# Patient Record
Sex: Female | Born: 1945 | Race: Black or African American | Hispanic: No | Smoking: Never smoker
Health system: Southern US, Community
[De-identification: ages and names within clinical notes are randomized; demographics above are authoritative.]

## PROBLEM LIST (undated history)

## (undated) DIAGNOSIS — M1990 Unspecified osteoarthritis, unspecified site: Secondary | ICD-10-CM

## (undated) DIAGNOSIS — I10 Essential (primary) hypertension: Secondary | ICD-10-CM

## (undated) DIAGNOSIS — N390 Urinary tract infection, site not specified: Secondary | ICD-10-CM

## (undated) DIAGNOSIS — E0590 Thyrotoxicosis, unspecified without thyrotoxic crisis or storm: Secondary | ICD-10-CM

## (undated) HISTORY — PX: COLONOSCOPY: SHX174

---

## 1999-04-22 ENCOUNTER — Ambulatory Visit (HOSPITAL_BASED_OUTPATIENT_CLINIC_OR_DEPARTMENT_OTHER): Admission: RE | Admit: 1999-04-22 | Discharge: 1999-04-22 | Payer: Self-pay | Admitting: Oral Surgery

## 1999-09-18 ENCOUNTER — Other Ambulatory Visit: Admission: RE | Admit: 1999-09-18 | Discharge: 1999-09-18 | Payer: Self-pay | Admitting: Obstetrics

## 1999-10-15 ENCOUNTER — Ambulatory Visit (HOSPITAL_COMMUNITY): Admission: RE | Admit: 1999-10-15 | Discharge: 1999-10-15 | Payer: Self-pay | Admitting: Obstetrics

## 1999-10-15 ENCOUNTER — Encounter: Payer: Self-pay | Admitting: Obstetrics

## 2000-11-03 ENCOUNTER — Encounter: Payer: Self-pay | Admitting: Obstetrics

## 2000-11-03 ENCOUNTER — Ambulatory Visit (HOSPITAL_COMMUNITY): Admission: RE | Admit: 2000-11-03 | Discharge: 2000-11-03 | Payer: Self-pay | Admitting: Obstetrics

## 2001-11-09 ENCOUNTER — Encounter: Payer: Self-pay | Admitting: Obstetrics

## 2001-11-09 ENCOUNTER — Ambulatory Visit (HOSPITAL_COMMUNITY): Admission: RE | Admit: 2001-11-09 | Discharge: 2001-11-09 | Payer: Self-pay | Admitting: Obstetrics

## 2002-11-11 ENCOUNTER — Encounter: Payer: Self-pay | Admitting: Obstetrics

## 2002-11-11 ENCOUNTER — Ambulatory Visit (HOSPITAL_COMMUNITY): Admission: RE | Admit: 2002-11-11 | Discharge: 2002-11-11 | Payer: Self-pay | Admitting: Obstetrics

## 2003-11-14 ENCOUNTER — Ambulatory Visit (HOSPITAL_COMMUNITY): Admission: RE | Admit: 2003-11-14 | Discharge: 2003-11-14 | Payer: Self-pay | Admitting: Obstetrics

## 2004-11-14 ENCOUNTER — Ambulatory Visit (HOSPITAL_COMMUNITY): Admission: RE | Admit: 2004-11-14 | Discharge: 2004-11-14 | Payer: Self-pay | Admitting: Obstetrics

## 2005-11-18 ENCOUNTER — Ambulatory Visit (HOSPITAL_COMMUNITY): Admission: RE | Admit: 2005-11-18 | Discharge: 2005-11-18 | Payer: Self-pay | Admitting: Obstetrics

## 2006-11-23 ENCOUNTER — Ambulatory Visit (HOSPITAL_COMMUNITY): Admission: RE | Admit: 2006-11-23 | Discharge: 2006-11-23 | Payer: Self-pay | Admitting: Obstetrics

## 2007-11-24 ENCOUNTER — Ambulatory Visit (HOSPITAL_COMMUNITY): Admission: RE | Admit: 2007-11-24 | Discharge: 2007-11-24 | Payer: Self-pay | Admitting: Obstetrics

## 2008-11-28 ENCOUNTER — Ambulatory Visit (HOSPITAL_COMMUNITY): Admission: RE | Admit: 2008-11-28 | Discharge: 2008-11-28 | Payer: Self-pay | Admitting: Obstetrics

## 2009-11-29 ENCOUNTER — Ambulatory Visit (HOSPITAL_COMMUNITY): Admission: RE | Admit: 2009-11-29 | Discharge: 2009-11-29 | Payer: Self-pay | Admitting: Obstetrics

## 2012-09-08 ENCOUNTER — Other Ambulatory Visit (HOSPITAL_COMMUNITY): Payer: Self-pay | Admitting: Family Medicine

## 2012-09-08 DIAGNOSIS — Z1231 Encounter for screening mammogram for malignant neoplasm of breast: Secondary | ICD-10-CM

## 2012-09-16 ENCOUNTER — Ambulatory Visit (HOSPITAL_COMMUNITY)
Admission: RE | Admit: 2012-09-16 | Discharge: 2012-09-16 | Disposition: A | Discharge status: Home / Self Care | Payer: BC Managed Care – PPO | Source: Ambulatory Visit | Attending: Family Medicine | Admitting: Family Medicine

## 2012-09-16 DIAGNOSIS — Z1231 Encounter for screening mammogram for malignant neoplasm of breast: Secondary | ICD-10-CM | POA: Insufficient documentation

## 2013-08-15 ENCOUNTER — Other Ambulatory Visit (HOSPITAL_COMMUNITY): Payer: Self-pay | Admitting: Family Medicine

## 2013-08-15 DIAGNOSIS — Z1231 Encounter for screening mammogram for malignant neoplasm of breast: Secondary | ICD-10-CM

## 2013-09-19 ENCOUNTER — Ambulatory Visit (HOSPITAL_COMMUNITY)
Admission: RE | Admit: 2013-09-19 | Discharge: 2013-09-19 | Disposition: A | Discharge status: Home / Self Care | Payer: Medicare PPO | Source: Ambulatory Visit | Attending: Family Medicine | Admitting: Family Medicine

## 2013-09-19 DIAGNOSIS — Z1231 Encounter for screening mammogram for malignant neoplasm of breast: Secondary | ICD-10-CM | POA: Insufficient documentation

## 2013-12-19 DIAGNOSIS — N390 Urinary tract infection, site not specified: Secondary | ICD-10-CM

## 2013-12-19 HISTORY — DX: Urinary tract infection, site not specified: N39.0

## 2013-12-22 NOTE — Progress Notes (Signed)
Will need ordedrs in EPIC by 12/27/13 please - pt is coming for preop TUES 12/27/13 - thank you

## 2013-12-23 ENCOUNTER — Encounter (HOSPITAL_COMMUNITY): Payer: Self-pay | Admitting: Pharmacy Technician

## 2013-12-23 ENCOUNTER — Other Ambulatory Visit: Payer: Self-pay | Admitting: Orthopedic Surgery

## 2013-12-26 ENCOUNTER — Other Ambulatory Visit (HOSPITAL_COMMUNITY): Payer: Self-pay | Admitting: Orthopedic Surgery

## 2013-12-26 NOTE — Patient Instructions (Signed)
Your procedure is scheduled on:  01/04/14  [facility]  Report to [facility]-- MAIN ENTRANCE- FOLLOW SIGNS TO SHORT STAY CENTER Short Stay Center at      1:45  PM  Call this number if you have problems the morning of surgery: [phone]        Do not eat food After Midnight. Tuesday NIGHT--- MAY HAVE CLEAR LIQUIDS Wednesday MORNING UNTIL 09:45 AM--- THEN NOTHING BY MOUTH   Take these medicines the morning of surgery with A SIP OF WATER:NO REGULAR MEDICATIONS MAY TAKE  NORCO FOR PAIN IF NEEDED   .  Contacts, dentures or partial plates, or metal hairpins  can not be worn to surgery. Your family will be responsible for glasses, dentures, hearing aides while you are in surgery  Leave suitcase in the car. After surgery it may be brought to your room.  For patients admitted to the hospital, checkout time is 11:00 AM day of  discharge.         [facility] IS NOT RESPONSIBLE FOR ANY VALUABLES                                                                  [facility] - Preparing for Surgery Before surgery, you can play an important role.  Because skin is not sterile, your skin needs to be as free of germs as possible.  You can reduce the number of germs on your skin by washing with CHG (chlorahexidine gluconate) soap before surgery.  CHG is an antiseptic cleaner which kills germs and bonds with the skin to continue killing germs even after washing. Please DO NOT use if you have an allergy to CHG or antibacterial soaps.  If your skin becomes reddened/irritated stop using the CHG and inform your nurse when you arrive at Short Stay. Do not shave (including legs and underarms) for at least 48 hours prior to the first CHG shower.  You may shave your face/neck. Please follow these instructions carefully:  1.  Shower with CHG Soap the night before surgery and the  morning of Surgery.  2.  If you choose to wash your hair, wash your hair first as usual with your  normal  shampoo.  3.  After  you shampoo, rinse your hair and body thoroughly to remove the  shampoo.                           4.  Use CHG as you would any other liquid soap.  You can apply chg directly  to the skin and wash                       Gently with a scrungie or clean washcloth.  5.  Apply the CHG Soap to your body ONLY FROM THE NECK DOWN.   Do not use on face/ open                           Wound or open sores. Avoid contact with eyes, ears mouth and genitals (private parts).  Wash face,  Genitals (private parts) with your normal soap.             6.  Wash thoroughly, paying special attention to the area where your surgery  will be performed.  7.  Thoroughly rinse your body with warm water from the neck down.  8.  DO NOT shower/wash with your normal soap after using and rinsing off  the CHG Soap.                9.  Pat yourself dry with a clean towel.            10.  Wear clean pajamas.            11.  Place clean sheets on your bed the night of your first shower and do not  sleep with pets. Day of Surgery : Do not apply any lotions/deodorants the morning of surgery.  Please wear clean clothes to the hospital/surgery center.  FAILURE TO FOLLOW THESE INSTRUCTIONS MAY RESULT IN THE CANCELLATION OF YOUR SURGERY PATIENT SIGNATURE_________________________________  NURSE SIGNATURE__________________________________  ________________________________________________________________________    CLEAR LIQUID DIET    WEDNESDAY MORNING UNTIL 09:45 am-  THEN NOTHING   Foods Allowed                                                                     Foods Excluded  Coffee and tea, regular and decaf                             liquids that you cannot  Plain Jell-O in any flavor                                             see through such as: Fruit ices (not with fruit pulp)                                     milk, soups, orange juice  Iced Popsicles                                    All solid  food Carbonated beverages, regular and diet                                    Cranberry, grape and apple juices Sports drinks like Gatorade Lightly seasoned clear broth or consume(fat free) Sugar, honey syrup  Sample Menu Breakfast                                Lunch                                     Supper Cranberry juice  Beef broth                            Chicken broth Jell-O                                     Grape juice                           Apple juice Coffee or tea                        Jell-O                                      Popsicle                                                Coffee or tea                        Coffee or tea  _____________________________________________________________________   WHAT IS A BLOOD TRANSFUSION? Blood Transfusion Information  A transfusion is the replacement of blood or some of its parts. Blood is made up of multiple cells which provide different functions.  Red blood cells carry oxygen and are used for blood loss replacement.  White blood cells fight against infection.  Platelets control bleeding.  Plasma helps clot blood.  Other blood products are available for specialized needs, such as hemophilia or other clotting disorders. BEFORE THE TRANSFUSION  Who gives blood for transfusions?   Healthy volunteers who are fully evaluated to make sure their blood is safe. This is blood bank blood. Transfusion therapy is the safest it has ever been in the practice of medicine. Before blood is taken from a donor, a complete history is taken to make sure that person has no history of diseases nor engages in risky social behavior (examples are intravenous drug use or sexual activity with multiple partners). The donor's travel history is screened to minimize risk of transmitting infections, such as malaria. The donated blood is tested for signs of infectious diseases, such as HIV and hepatitis. The blood is then tested  to be sure it is compatible with you in order to minimize the chance of a transfusion reaction. If you or a relative donates blood, this is often done in anticipation of surgery and is not appropriate for emergency situations. It takes many days to process the donated blood. RISKS AND COMPLICATIONS Although transfusion therapy is very safe and saves many lives, the main dangers of transfusion include:   Getting an infectious disease.  Developing a transfusion reaction. This is an allergic reaction to something in the blood you were given. Every precaution is taken to prevent this. The decision to have a blood transfusion has been considered carefully by your caregiver before blood is given. Blood is not given unless the benefits outweigh the risks. AFTER THE TRANSFUSION  Right after receiving a blood transfusion, you will usually feel much better and more energetic. This is especially true if your red blood cells have gotten low (anemic). The transfusion raises  the level of the red blood cells which carry oxygen, and this usually causes an energy increase.  The nurse administering the transfusion will monitor you carefully for complications. HOME CARE INSTRUCTIONS  No special instructions are needed after a transfusion. You may find your energy is better. Speak with your caregiver about any limitations on activity for underlying diseases you may have. SEEK MEDICAL CARE IF:   Your condition is not improving after your transfusion.  You develop redness or irritation at the intravenous (IV) site. SEEK IMMEDIATE MEDICAL CARE IF:  Any of the following symptoms occur over the next 12 hours:  Shaking chills.  You have a temperature by mouth above 102 F (38.9 C), not controlled by medicine.  Chest, back, or muscle pain.  People around you feel you are not acting correctly or are confused.  Shortness of breath or difficulty breathing.  Dizziness and fainting.  You get a rash or develop  hives.  You have a decrease in urine output.  Your urine turns a dark color or changes to pink, red, or brown. Any of the following symptoms occur over the next 10 days:  You have a temperature by mouth above 102 F (38.9 C), not controlled by medicine.  Shortness of breath.  Weakness after normal activity.  The white part of the eye turns yellow (jaundice).  You have a decrease in the amount of urine or are urinating less often.  Your urine turns a dark color or changes to pink, red, or brown. Document Released: 07/04/2000 Document Revised: 09/29/2011 Document Reviewed: 02/21/2008 [employer] Patient Information 2014 [place], [place].  _______________________________________________________________________  Incentive Spirometer  An incentive spirometer is a tool that can help keep your lungs clear and active. This tool measures how well you are filling your lungs with each breath. Taking long deep breaths may help reverse or decrease the chance of developing breathing (pulmonary) problems (especially infection) following:  A long period of time when you are unable to move or be active. BEFORE THE PROCEDURE   If the spirometer includes an indicator to show your best effort, your nurse or respiratory therapist will set it to a desired goal.  If possible, sit up straight or lean slightly forward. Try not to slouch.  Hold the incentive spirometer in an upright position. INSTRUCTIONS FOR USE  1. Sit on the edge of your bed if possible, or sit up as far as you can in bed or on a chair. 2. Hold the incentive spirometer in an upright position. 3. Breathe out normally. 4. Place the mouthpiece in your mouth and seal your lips tightly around it. 5. Breathe in slowly and as deeply as possible, raising the piston or the ball toward the top of the column. 6. Hold your breath for 3-5 seconds or for as long as possible. Allow the piston or ball to fall to the bottom of the column. 7. Remove  the mouthpiece from your mouth and breathe out normally. 8. Rest for a few seconds and repeat Steps 1 through 7 at least 10 times every 1-2 hours when you are awake. Take your time and take a few normal breaths between deep breaths. 9. The spirometer may include an indicator to show your best effort. Use the indicator as a goal to work toward during each repetition. 10. After each set of 10 deep breaths, practice coughing to be sure your lungs are clear. If you have an incision (the cut made at the time of surgery), support your incision  when coughing by placing a pillow or rolled up towels firmly against it. Once you are able to get out of bed, walk around indoors and cough well. You may stop using the incentive spirometer when instructed by your caregiver.  RISKS AND COMPLICATIONS  Take your time so you do not get dizzy or light-headed.  If you are in pain, you may need to take or ask for pain medication before doing incentive spirometry. It is harder to take a deep breath if you are having pain. AFTER USE  Rest and breathe slowly and easily.  It can be helpful to keep track of a log of your progress. Your caregiver can provide you with a simple table to help with this. If you are using the spirometer at home, follow these instructions: SEEK MEDICAL CARE IF:   You are having difficultly using the spirometer.  You have trouble using the spirometer as often as instructed.  Your pain medication is not giving enough relief while using the spirometer.  You develop fever of 100.5 F (38.1 C) or higher. SEEK IMMEDIATE MEDICAL CARE IF:   You cough up bloody sputum that had not been present before.  You develop fever of 102 F (38.9 C) or greater.  You develop worsening pain at or near the incision site. MAKE SURE YOU:   Understand these instructions.  Will watch your condition.  Will get help right away if you are not doing well or get worse. Document Released: 11/17/2006 Document  Revised: 09/29/2011 Document Reviewed: 01/18/2007 [facility] Patient Information 2014 [place], [place].   ________________________________________________________________________

## 2013-12-27 ENCOUNTER — Ambulatory Visit (HOSPITAL_COMMUNITY)
Admission: RE | Admit: 2013-12-27 | Discharge: 2013-12-27 | Disposition: A | Discharge status: Home / Self Care | Payer: Medicare PPO | Source: Ambulatory Visit | Attending: Orthopedic Surgery | Admitting: Orthopedic Surgery

## 2013-12-27 ENCOUNTER — Encounter (HOSPITAL_COMMUNITY): Payer: Self-pay

## 2013-12-27 ENCOUNTER — Encounter (HOSPITAL_COMMUNITY)
Admission: RE | Admit: 2013-12-27 | Discharge: 2013-12-27 | Disposition: A | Discharge status: Home / Self Care | Payer: Medicare PPO | Source: Ambulatory Visit | Attending: Orthopedic Surgery | Admitting: Orthopedic Surgery

## 2013-12-27 ENCOUNTER — Encounter (INDEPENDENT_AMBULATORY_CARE_PROVIDER_SITE_OTHER): Payer: Self-pay

## 2013-12-27 ENCOUNTER — Ambulatory Visit (HOSPITAL_COMMUNITY)
Admission: RE | Admit: 2013-12-27 | Discharge: 2013-12-27 | Disposition: A | Discharge status: Home / Self Care | Payer: Medicare PPO | Source: Ambulatory Visit | Attending: Anesthesiology | Admitting: Anesthesiology

## 2013-12-27 DIAGNOSIS — Z01812 Encounter for preprocedural laboratory examination: Secondary | ICD-10-CM | POA: Insufficient documentation

## 2013-12-27 DIAGNOSIS — M169 Osteoarthritis of hip, unspecified: Secondary | ICD-10-CM | POA: Insufficient documentation

## 2013-12-27 DIAGNOSIS — M47817 Spondylosis without myelopathy or radiculopathy, lumbosacral region: Secondary | ICD-10-CM | POA: Insufficient documentation

## 2013-12-27 DIAGNOSIS — I10 Essential (primary) hypertension: Secondary | ICD-10-CM | POA: Insufficient documentation

## 2013-12-27 DIAGNOSIS — Z01818 Encounter for other preprocedural examination: Secondary | ICD-10-CM | POA: Insufficient documentation

## 2013-12-27 DIAGNOSIS — M1610 Unilateral primary osteoarthritis, unspecified hip: Secondary | ICD-10-CM | POA: Insufficient documentation

## 2013-12-27 HISTORY — DX: Urinary tract infection, site not specified: N39.0

## 2013-12-27 HISTORY — DX: Unspecified osteoarthritis, unspecified site: M19.90

## 2013-12-27 HISTORY — DX: Essential (primary) hypertension: I10

## 2013-12-27 LAB — HEPATIC FUNCTION PANEL
ALT: 17 U/L (ref 0–35)
AST: 17 U/L (ref 0–37)
Albumin: 3.7 g/dL (ref 3.5–5.2)
Alkaline Phosphatase: 76 U/L (ref 39–117)
Bilirubin, Direct: <0.2 mg/dL (ref 0.0–0.3)
Total Bilirubin: 0.4 mg/dL (ref 0.3–1.2)
Total Protein: 7.7 g/dL (ref 6.0–8.3)

## 2013-12-27 LAB — SURGICAL PCR SCREEN
MRSA, PCR: NEGATIVE
Staphylococcus aureus: POSITIVE — AB

## 2013-12-27 LAB — URINALYSIS, ROUTINE W REFLEX MICROSCOPIC
Bilirubin Urine: NEGATIVE
Glucose, UA: NEGATIVE mg/dL
Ketones, ur: NEGATIVE mg/dL
Nitrite: NEGATIVE
Protein, ur: NEGATIVE mg/dL
Specific Gravity, Urine: 1.021 (ref 1.005–1.030)
Urobilinogen, UA: 1 mg/dL (ref 0.0–1.0)
pH: 5.5 (ref 5.0–8.0)

## 2013-12-27 LAB — URINE MICROSCOPIC-ADD ON

## 2013-12-27 LAB — ABO/RH: ABO/RH(D): O POS

## 2013-12-27 LAB — PROTIME-INR
INR: 1.02 (ref 0.00–1.49)
Prothrombin Time: 13.2 seconds (ref 11.6–15.2)

## 2013-12-27 LAB — APTT: aPTT: 28 seconds (ref 24–37)

## 2013-12-27 NOTE — Progress Notes (Signed)
Clearance with note Dr Jillyn Hidden chart, EKG 6/15 chart, CBC, BMP, urinalysis 12/23/13 on chart.  Patient states has started antibiotic for UTI per Dr Jillyn Hidden.  Is to have recheck urine 6/15.  States also has appt at [facility] 01/02/14-  instructed to notify them as well.  Verbalized understanding

## 2013-12-27 NOTE — Progress Notes (Signed)
12/27/13 1130  OBSTRUCTIVE SLEEP APNEA  Have you ever been diagnosed with sleep apnea through a sleep study? No  Do you snore loudly (loud enough to be heard through closed doors)?  1  Do you often feel tired, fatigued, or sleepy during the daytime? 0  Has anyone observed you stop breathing during your sleep? 0  Do you have, or are you being treated for high blood pressure? 1  BMI more than 35 kg/m2? 1  Age over 66 years old? 1  Neck circumference greater than 40 cm/16 inches? 0  Gender: 0  Obstructive Sleep Apnea Score 4  Score 4 or greater  Results sent to PCP

## 2013-12-28 NOTE — Progress Notes (Signed)
Received fax from Dr Lequita Halt- no action needed per faxed urine

## 2014-01-03 ENCOUNTER — Other Ambulatory Visit: Payer: Self-pay | Admitting: Surgical

## 2014-01-03 NOTE — H&P (Signed)
TOTAL HIP ADMISSION H&P  Patient is admitted for left total hip arthroplasty.  Subjective:  Chief Complaint: left hip pain  HPI: Lisa CommonsJacqueline Wall, 68 y.o. female, has a history of pain and functional disability in the left hip(s) due to arthritis and patient has failed non-surgical conservative treatments for greater than 12 weeks to include NSAID's and/or analgesics, corticosteriod injections, flexibility and strengthening excercises and activity modification.  Onset of symptoms was gradual starting 3 years ago with gradually worsening course since that time.The patient noted no past surgery on the left hip(s).  Patient currently rates pain in the left hip at 7 out of 10 with activity. Patient has night pain, worsening of pain with activity and weight bearing, pain that interfers with activities of daily living, pain with passive range of motion and crepitus. Patient has evidence of subchondral cysts, periarticular osteophytes and joint space narrowing by imaging studies. This condition presents safety issues increasing the risk of falls.  There is no current active infection.  Past Medical History  Diagnosis Date  . Hypertension   . Arthritis   . Urinary tract infection 6/15    began antibiotics 12/25/13    Past Surgical History  Procedure Laterality Date  . Colonoscopy       Current outpatient prescriptions: acetaminophen (TYLENOL) 500 MG tablet, Take 1,000 mg by mouth every 6 (six) hours as needed for mild pain., Disp: , Rfl: ;   amLODipine-benazepril (LOTREL) 5-10 MG per capsule, Take 1 capsule by mouth 2 (two) times daily., Disp: , Rfl: ;   cephALEXin (KEFLEX) 500 MG capsule, Take 500 mg by mouth 3 (three) times daily. Began 12/25/13, Disp: , Rfl:  hydrochlorothiazide (HYDRODIURIL) 25 MG tablet, Take 25 mg by mouth every morning., Disp: , Rfl: ;   HYDROcodone-acetaminophen (NORCO/VICODIN) 5-325 MG per tablet, Take 1-2 tablets by mouth 2 (two) times daily as needed for moderate pain.,  Disp: , Rfl:   No Known Allergies  History  Substance Use Topics  . Smoking status: Never Smoker   . Smokeless tobacco: Never Used  . Alcohol Use: No    Review of Systems  Constitutional: Negative.   HENT: Negative.   Eyes: Negative.   Respiratory: Negative.   Cardiovascular: Negative.   Gastrointestinal: Negative.   Genitourinary: Negative.   Musculoskeletal: Positive for back pain and joint pain. Negative for falls, myalgias and neck pain.       Left hip pain  Skin: Negative.   Neurological: Negative.   Endo/Heme/Allergies: Negative.   Psychiatric/Behavioral: Negative.     Objective:  Physical Exam  Constitutional: She is oriented to person, place, and time. She appears well-developed. No distress.  Obese  HENT:  Head: Normocephalic and atraumatic.  Right Ear: External ear normal.  Left Ear: External ear normal.  Nose: Nose normal.  Mouth/Throat: Oropharynx is clear and moist.  Eyes: Conjunctivae and EOM are normal.  Neck: Normal range of motion. Neck supple.  Cardiovascular: Normal rate, regular rhythm, normal heart sounds and intact distal pulses.   No murmur heard. Respiratory: Effort normal and breath sounds normal. No respiratory distress. She has no wheezes.  GI: Soft. Bowel sounds are normal. She exhibits no distension. There is no tenderness.  Musculoskeletal:       Right hip: Normal.       Left hip: She exhibits decreased range of motion and decreased strength. She exhibits no tenderness.       Right knee: She exhibits normal range of motion and no swelling. No tenderness found.  Left knee: She exhibits normal range of motion and no swelling. No tenderness found.       Right lower leg: She exhibits no tenderness and no swelling.       Left lower leg: She exhibits no tenderness and no swelling.  The right hip has a normal range of motion and no discomfort. The left hip flexion is to about 100. There is no internal rotation and about 10 external  rotation and 10 abduction. The left knee exam shows some crepitus on range of motion with no effusion, tenderness or instability. Gait pattern is significantly antalgic on the left side  Neurological: She is alert and oriented to person, place, and time. She has normal strength and normal reflexes. No sensory deficit.  Skin: No rash noted. She is not diaphoretic. No erythema.  Psychiatric: Her behavior is normal.    Vitals Weight: 266 lb Height: 63 in Body Surface Area: 2.32 m Body Mass Index: 47.12 kg/m Pulse: 72 (Regular) BP: 132/80 (Sitting, Left Arm, Standard)  Imaging Review Plain radiographs demonstrate severe degenerative joint disease of the left hip(s). The bone quality appears to be adequate for age and reported activity level.  Assessment/Plan:  End stage arthritis, left hip(s)  The patient history, physical examination, clinical judgement of the provider and imaging studies are consistent with end stage degenerative joint disease of the left hip(s) and total hip arthroplasty is deemed medically necessary. The treatment options including medical management, injection therapy, arthroscopy and arthroplasty were discussed at length. The risks and benefits of total hip arthroplasty were presented and reviewed. The risks due to aseptic loosening, infection, stiffness, dislocation/subluxation,  thromboembolic complications and other imponderables were discussed.  The patient acknowledged the explanation, agreed to proceed with the plan and consent was signed. Patient is being admitted for inpatient treatment for surgery, pain control, PT, OT, prophylactic antibiotics, VTE prophylaxis, progressive ambulation and ADL's and discharge planning.The patient is planning to be discharged home with home health services   Patient is ok to receive TXA     Dimitri PedAmber Mialynn Shelvin, PA-C

## 2014-01-04 ENCOUNTER — Inpatient Hospital Stay (HOSPITAL_COMMUNITY): Payer: Medicare PPO

## 2014-01-04 ENCOUNTER — Inpatient Hospital Stay (HOSPITAL_COMMUNITY): Payer: Medicare PPO | Admitting: Registered Nurse

## 2014-01-04 ENCOUNTER — Encounter (HOSPITAL_COMMUNITY)
Admission: RE | Disposition: A | Discharge status: Home Health | Payer: Self-pay | Source: Ambulatory Visit | Attending: Orthopedic Surgery

## 2014-01-04 ENCOUNTER — Encounter (HOSPITAL_COMMUNITY): Payer: Self-pay | Admitting: *Deleted

## 2014-01-04 ENCOUNTER — Inpatient Hospital Stay (HOSPITAL_COMMUNITY)
Admission: RE | Admit: 2014-01-04 | Discharge: 2014-01-06 | DRG: 470 | Disposition: A | Discharge status: Home Health | Payer: Medicare PPO | Source: Ambulatory Visit | Attending: Orthopedic Surgery | Admitting: Orthopedic Surgery

## 2014-01-04 ENCOUNTER — Encounter (HOSPITAL_COMMUNITY): Payer: Medicare PPO | Admitting: Registered Nurse

## 2014-01-04 DIAGNOSIS — Z96642 Presence of left artificial hip joint: Secondary | ICD-10-CM

## 2014-01-04 DIAGNOSIS — M169 Osteoarthritis of hip, unspecified: Secondary | ICD-10-CM

## 2014-01-04 DIAGNOSIS — M1610 Unilateral primary osteoarthritis, unspecified hip: Principal | ICD-10-CM | POA: Diagnosis present

## 2014-01-04 DIAGNOSIS — I10 Essential (primary) hypertension: Secondary | ICD-10-CM | POA: Diagnosis present

## 2014-01-04 DIAGNOSIS — Z6841 Body Mass Index (BMI) 40.0 and over, adult: Secondary | ICD-10-CM

## 2014-01-04 HISTORY — PX: TOTAL HIP ARTHROPLASTY: SHX124

## 2014-01-04 LAB — TYPE AND SCREEN
ABO/RH(D): O POS
Antibody Screen: NEGATIVE

## 2014-01-04 SURGERY — ARTHROPLASTY, HIP, TOTAL, ANTERIOR APPROACH
Anesthesia: Spinal | Site: Hip | Laterality: Left

## 2014-01-04 MED ORDER — PHENOL 1.4 % MT LIQD
1.0000 | OROMUCOSAL | Status: DC | PRN
  Filled 2014-01-04: qty 177

## 2014-01-04 MED ORDER — ONDANSETRON HCL 4 MG/2ML IJ SOLN
INTRAMUSCULAR | Status: DC | PRN
  Administered 2014-01-04: 4 mg via INTRAVENOUS

## 2014-01-04 MED ORDER — ACETAMINOPHEN 650 MG RE SUPP: 650.0000 mg | Freq: Four times a day (QID) | RECTAL | Status: DC | PRN

## 2014-01-04 MED ORDER — METOCLOPRAMIDE HCL 5 MG/ML IJ SOLN
5.0000 mg | Freq: Three times a day (TID) | INTRAMUSCULAR | Status: DC | PRN
  Administered 2014-01-05: 10 mg via INTRAVENOUS
  Filled 2014-01-04: qty 2

## 2014-01-04 MED ORDER — ACETAMINOPHEN 10 MG/ML IV SOLN
1000.0000 mg | Freq: Once | INTRAVENOUS | Status: AC
  Administered 2014-01-04: 1000 mg via INTRAVENOUS
  Filled 2014-01-04: qty 100

## 2014-01-04 MED ORDER — KETOROLAC TROMETHAMINE 15 MG/ML IJ SOLN
7.5000 mg | Freq: Four times a day (QID) | INTRAMUSCULAR | Status: AC | PRN
  Administered 2014-01-04: 7.5 mg via INTRAVENOUS
  Filled 2014-01-04: qty 1

## 2014-01-04 MED ORDER — POLYETHYLENE GLYCOL 3350 17 G PO PACK: 17.0000 g | PACK | Freq: Every day | ORAL | Status: DC | PRN

## 2014-01-04 MED ORDER — BISACODYL 10 MG RE SUPP: 10.0000 mg | Freq: Every day | RECTAL | Status: DC | PRN

## 2014-01-04 MED ORDER — DEXAMETHASONE 6 MG PO TABS
10.0000 mg | ORAL_TABLET | Freq: Every day | ORAL | Status: AC
  Administered 2014-01-05: 10 mg via ORAL
  Filled 2014-01-04: qty 1

## 2014-01-04 MED ORDER — FENTANYL CITRATE 0.05 MG/ML IJ SOLN
INTRAMUSCULAR | Status: AC
  Filled 2014-01-04: qty 2

## 2014-01-04 MED ORDER — HYDROCHLOROTHIAZIDE 25 MG PO TABS
25.0000 mg | ORAL_TABLET | Freq: Every morning | ORAL | Status: DC
  Administered 2014-01-05 – 2014-01-06 (×2): 25 mg via ORAL
  Filled 2014-01-04 (×2): qty 1

## 2014-01-04 MED ORDER — BUPIVACAINE HCL (PF) 0.25 % IJ SOLN
INTRAMUSCULAR | Status: DC | PRN
  Administered 2014-01-04: 30 mL

## 2014-01-04 MED ORDER — DEXAMETHASONE SODIUM PHOSPHATE 10 MG/ML IJ SOLN
10.0000 mg | Freq: Once | INTRAMUSCULAR | Status: AC
  Administered 2014-01-04: 10 mg via INTRAVENOUS

## 2014-01-04 MED ORDER — BUPIVACAINE HCL (PF) 0.25 % IJ SOLN
INTRAMUSCULAR | Status: AC
  Filled 2014-01-04: qty 30

## 2014-01-04 MED ORDER — ONDANSETRON HCL 4 MG/2ML IJ SOLN
INTRAMUSCULAR | Status: AC
  Filled 2014-01-04: qty 2

## 2014-01-04 MED ORDER — BUPIVACAINE LIPOSOME 1.3 % IJ SUSP
20.0000 mL | Freq: Once | INTRAMUSCULAR | Status: DC
  Filled 2014-01-04: qty 20

## 2014-01-04 MED ORDER — ONDANSETRON HCL 4 MG PO TABS: 4.0000 mg | ORAL_TABLET | Freq: Four times a day (QID) | ORAL | Status: DC | PRN

## 2014-01-04 MED ORDER — RIVAROXABAN 10 MG PO TABS
10.0000 mg | ORAL_TABLET | Freq: Every day | ORAL | Status: DC
  Administered 2014-01-05 – 2014-01-06 (×2): 10 mg via ORAL
  Filled 2014-01-04 (×3): qty 1

## 2014-01-04 MED ORDER — METHOCARBAMOL 500 MG PO TABS
500.0000 mg | ORAL_TABLET | Freq: Four times a day (QID) | ORAL | Status: DC | PRN
  Administered 2014-01-05 – 2014-01-06 (×3): 500 mg via ORAL
  Filled 2014-01-04 (×5): qty 1

## 2014-01-04 MED ORDER — SODIUM CHLORIDE 0.9 % IV SOLN
INTRAVENOUS | Status: DC
  Administered 2014-01-04: 22:00:00 via INTRAVENOUS

## 2014-01-04 MED ORDER — BUPIVACAINE HCL (PF) 0.5 % IJ SOLN
INTRAMUSCULAR | Status: AC
  Filled 2014-01-04: qty 30

## 2014-01-04 MED ORDER — LACTATED RINGERS IV SOLN
INTRAVENOUS | Status: DC
  Administered 2014-01-04 (×2): via INTRAVENOUS
  Administered 2014-01-04: 1000 mL via INTRAVENOUS

## 2014-01-04 MED ORDER — MIDAZOLAM HCL 2 MG/2ML IJ SOLN
INTRAMUSCULAR | Status: AC
  Filled 2014-01-04: qty 2

## 2014-01-04 MED ORDER — PROPOFOL 10 MG/ML IV BOLUS
INTRAVENOUS | Status: AC
  Filled 2014-01-04: qty 20

## 2014-01-04 MED ORDER — ACETAMINOPHEN 500 MG PO TABS
1000.0000 mg | ORAL_TABLET | Freq: Four times a day (QID) | ORAL | Status: AC
  Administered 2014-01-04 – 2014-01-05 (×4): 1000 mg via ORAL
  Filled 2014-01-04 (×4): qty 2

## 2014-01-04 MED ORDER — FENTANYL CITRATE 0.05 MG/ML IJ SOLN
INTRAMUSCULAR | Status: DC | PRN
  Administered 2014-01-04: 100 ug via INTRAVENOUS

## 2014-01-04 MED ORDER — TRANEXAMIC ACID 100 MG/ML IV SOLN
1000.0000 mg | INTRAVENOUS | Status: AC
  Administered 2014-01-04: 1000 mg via INTRAVENOUS
  Filled 2014-01-04: qty 10

## 2014-01-04 MED ORDER — MEPERIDINE HCL 50 MG/ML IJ SOLN: 6.2500 mg | INTRAMUSCULAR | Status: DC | PRN

## 2014-01-04 MED ORDER — TRAMADOL HCL 50 MG PO TABS: 50.0000 mg | ORAL_TABLET | Freq: Four times a day (QID) | ORAL | Status: DC | PRN

## 2014-01-04 MED ORDER — SODIUM CHLORIDE 0.9 % IV SOLN: INTRAVENOUS | Status: DC

## 2014-01-04 MED ORDER — PROMETHAZINE HCL 25 MG/ML IJ SOLN: 6.2500 mg | INTRAMUSCULAR | Status: DC | PRN

## 2014-01-04 MED ORDER — ONDANSETRON HCL 4 MG/2ML IJ SOLN: 4.0000 mg | Freq: Four times a day (QID) | INTRAMUSCULAR | Status: DC | PRN

## 2014-01-04 MED ORDER — DEXTROSE 5 % IV SOLN
3.0000 g | Freq: Once | INTRAVENOUS | Status: AC
  Administered 2014-01-04: 3 g via INTRAVENOUS
  Filled 2014-01-04: qty 3000

## 2014-01-04 MED ORDER — MENTHOL 3 MG MT LOZG
1.0000 | LOZENGE | OROMUCOSAL | Status: DC | PRN
  Filled 2014-01-04: qty 9

## 2014-01-04 MED ORDER — 0.9 % SODIUM CHLORIDE (POUR BTL) OPTIME
TOPICAL | Status: DC | PRN
  Administered 2014-01-04: 1000 mL

## 2014-01-04 MED ORDER — SODIUM CHLORIDE 0.9 % IJ SOLN
INTRAMUSCULAR | Status: AC
  Filled 2014-01-04: qty 50

## 2014-01-04 MED ORDER — METOCLOPRAMIDE HCL 10 MG PO TABS: 5.0000 mg | ORAL_TABLET | Freq: Three times a day (TID) | ORAL | Status: DC | PRN

## 2014-01-04 MED ORDER — METHOCARBAMOL 1000 MG/10ML IJ SOLN
500.0000 mg | Freq: Four times a day (QID) | INTRAVENOUS | Status: DC | PRN
  Administered 2014-01-04: 500 mg via INTRAVENOUS
  Filled 2014-01-04: qty 5

## 2014-01-04 MED ORDER — DEXAMETHASONE SODIUM PHOSPHATE 10 MG/ML IJ SOLN
INTRAMUSCULAR | Status: AC
  Filled 2014-01-04: qty 1

## 2014-01-04 MED ORDER — ACETAMINOPHEN 325 MG PO TABS
650.0000 mg | ORAL_TABLET | Freq: Four times a day (QID) | ORAL | Status: DC | PRN
  Administered 2014-01-06 (×2): 650 mg via ORAL
  Filled 2014-01-04 (×2): qty 2

## 2014-01-04 MED ORDER — CEFAZOLIN SODIUM-DEXTROSE 2-3 GM-% IV SOLR
2.0000 g | Freq: Four times a day (QID) | INTRAVENOUS | Status: AC
  Administered 2014-01-04 – 2014-01-05 (×2): 2 g via INTRAVENOUS
  Filled 2014-01-04 (×2): qty 50

## 2014-01-04 MED ORDER — SODIUM CHLORIDE 0.9 % IJ SOLN
INTRAMUSCULAR | Status: DC | PRN
  Administered 2014-01-04: 30 mL

## 2014-01-04 MED ORDER — PROPOFOL INFUSION 10 MG/ML OPTIME
INTRAVENOUS | Status: DC | PRN
  Administered 2014-01-04: 50 ug/kg/min via INTRAVENOUS

## 2014-01-04 MED ORDER — PROPOFOL 10 MG/ML IV BOLUS
INTRAVENOUS | Status: DC | PRN
  Administered 2014-01-04: 50 mg via INTRAVENOUS

## 2014-01-04 MED ORDER — FLEET ENEMA 7-19 GM/118ML RE ENEM: 1.0000 | ENEMA | Freq: Once | RECTAL | Status: AC | PRN

## 2014-01-04 MED ORDER — BUPIVACAINE LIPOSOME 1.3 % IJ SUSP
INTRAMUSCULAR | Status: DC | PRN
  Administered 2014-01-04: 20 mL

## 2014-01-04 MED ORDER — FENTANYL CITRATE 0.05 MG/ML IJ SOLN
25.0000 ug | INTRAMUSCULAR | Status: DC | PRN
  Administered 2014-01-04: 50 ug via INTRAVENOUS

## 2014-01-04 MED ORDER — DIPHENHYDRAMINE HCL 12.5 MG/5ML PO ELIX: 12.5000 mg | ORAL_SOLUTION | ORAL | Status: DC | PRN

## 2014-01-04 MED ORDER — AMLODIPINE BESYLATE 5 MG PO TABS
5.0000 mg | ORAL_TABLET | Freq: Every day | ORAL | Status: DC
  Administered 2014-01-04: 5 mg via ORAL
  Filled 2014-01-04: qty 1

## 2014-01-04 MED ORDER — DEXAMETHASONE SODIUM PHOSPHATE 10 MG/ML IJ SOLN
10.0000 mg | Freq: Every day | INTRAMUSCULAR | Status: AC
  Filled 2014-01-04: qty 1

## 2014-01-04 MED ORDER — PHENYLEPHRINE HCL 10 MG/ML IJ SOLN
10.0000 mg | INTRAMUSCULAR | Status: DC | PRN
  Administered 2014-01-04: 10 ug/min via INTRAVENOUS

## 2014-01-04 MED ORDER — PHENYLEPHRINE HCL 10 MG/ML IJ SOLN
INTRAMUSCULAR | Status: DC | PRN
  Administered 2014-01-04: 160 ug via INTRAVENOUS

## 2014-01-04 MED ORDER — DOCUSATE SODIUM 100 MG PO CAPS
100.0000 mg | ORAL_CAPSULE | Freq: Two times a day (BID) | ORAL | Status: DC
  Administered 2014-01-04 – 2014-01-06 (×2): 100 mg via ORAL

## 2014-01-04 MED ORDER — BUPIVACAINE HCL (PF) 0.5 % IJ SOLN
INTRAMUSCULAR | Status: DC | PRN
  Administered 2014-01-04: 3 mL

## 2014-01-04 MED ORDER — MORPHINE SULFATE 2 MG/ML IJ SOLN: 1.0000 mg | INTRAMUSCULAR | Status: DC | PRN

## 2014-01-04 MED ORDER — OXYCODONE HCL 5 MG PO TABS
5.0000 mg | ORAL_TABLET | ORAL | Status: DC | PRN
  Administered 2014-01-05: 5 mg via ORAL
  Filled 2014-01-04: qty 1
  Filled 2014-01-04: qty 2

## 2014-01-04 MED ORDER — MIDAZOLAM HCL 5 MG/5ML IJ SOLN
INTRAMUSCULAR | Status: DC | PRN
  Administered 2014-01-04 (×2): 1 mg via INTRAVENOUS

## 2014-01-04 SURGICAL SUPPLY — 41 items
BAG SPEC THK2 15X12 ZIP CLS (MISCELLANEOUS)
BAG ZIPLOCK 12X15 (MISCELLANEOUS) IMPLANT
BLADE EXTENDED COATED 6.5IN (ELECTRODE) ×2 IMPLANT
BLADE SAW SGTL 18X1.27X75 (BLADE) IMPLANT
CAPT HIP PF COP ×2 IMPLANT
COVER PERINEAL POST (MISCELLANEOUS) ×2 IMPLANT
DECANTER SPIKE VIAL GLASS SM (MISCELLANEOUS) ×2 IMPLANT
DRAPE C-ARM 42X120 X-RAY (DRAPES) ×2 IMPLANT
DRAPE STERI IOBAN 125X83 (DRAPES) ×2 IMPLANT
DRAPE U-SHAPE 47X51 STRL (DRAPES) ×6 IMPLANT
DRSG ADAPTIC 3X8 NADH LF (GAUZE/BANDAGES/DRESSINGS) ×2 IMPLANT
DRSG MEPILEX BORDER 4X4 (GAUZE/BANDAGES/DRESSINGS) ×2 IMPLANT
DRSG MEPILEX BORDER 4X8 (GAUZE/BANDAGES/DRESSINGS) ×2 IMPLANT
DURAPREP 26ML APPLICATOR (WOUND CARE) ×2 IMPLANT
ELECT REM PT RETURN 9FT ADLT (ELECTROSURGICAL) ×2
ELECTRODE REM PT RTRN 9FT ADLT (ELECTROSURGICAL) ×1 IMPLANT
EVACUATOR 1/8 PVC DRAIN (DRAIN) ×2 IMPLANT
FACESHIELD WRAPAROUND (MASK) ×8 IMPLANT
GAUZE SPONGE 4X4 12PLY STRL (GAUZE/BANDAGES/DRESSINGS) IMPLANT
GLOVE BIO SURGEON STRL SZ7.5 (GLOVE) ×2 IMPLANT
GLOVE BIO SURGEON STRL SZ8 (GLOVE) ×4 IMPLANT
GLOVE BIOGEL PI IND STRL 8 (GLOVE) ×2 IMPLANT
GLOVE BIOGEL PI INDICATOR 8 (GLOVE) ×2
GOWN STRL REUS W/TWL LRG LVL3 (GOWN DISPOSABLE) ×2 IMPLANT
GOWN STRL REUS W/TWL XL LVL3 (GOWN DISPOSABLE) ×2 IMPLANT
KIT BASIN OR (CUSTOM PROCEDURE TRAY) ×2 IMPLANT
NDL SAFETY ECLIPSE 18X1.5 (NEEDLE) ×2 IMPLANT
NEEDLE HYPO 18GX1.5 SHARP (NEEDLE) ×4
PACK TOTAL JOINT (CUSTOM PROCEDURE TRAY) ×2 IMPLANT
PADDING CAST COTTON 6X4 STRL (CAST SUPPLIES) IMPLANT
STRIP CLOSURE SKIN 1/2X4 (GAUZE/BANDAGES/DRESSINGS) ×2 IMPLANT
SUCTION FRAZIER 12FR DISP (SUCTIONS) IMPLANT
SUT ETHIBOND NAB CT1 #1 30IN (SUTURE) ×2 IMPLANT
SUT MNCRL AB 4-0 PS2 18 (SUTURE) ×2 IMPLANT
SUT VIC AB 2-0 CT1 27 (SUTURE) ×8
SUT VIC AB 2-0 CT1 TAPERPNT 27 (SUTURE) ×4 IMPLANT
SUT VLOC 180 0 24IN GS25 (SUTURE) ×2 IMPLANT
SYR 20CC LL (SYRINGE) ×2 IMPLANT
SYR 50ML LL SCALE MARK (SYRINGE) ×2 IMPLANT
TOWEL OR 17X26 10 PK STRL BLUE (TOWEL DISPOSABLE) ×2 IMPLANT
TRAY FOLEY CATH 14FRSI W/METER (CATHETERS) ×2 IMPLANT

## 2014-01-04 NOTE — Anesthesia Postprocedure Evaluation (Signed)
  Anesthesia Post-op Note  Patient: Lisa Wall  Procedure(s) Performed: Procedure(s) (LRB): LEFT TOTAL HIP ARTHROPLASTY ANTERIOR APPROACH (Left)  Patient Location: PACU  Anesthesia Type: General  Level of Consciousness: awake and alert   Airway and Oxygen Therapy: Patient Spontanous Breathing  Post-op Pain: mild  Post-op Assessment: Post-op Vital signs reviewed, Patient's Cardiovascular Status Stable, Respiratory Function Stable, Patent Airway and No signs of Nausea or vomiting  Last Vitals:  Filed Vitals:   01/04/14 1946  BP: 122/78  Pulse: 62  Temp: 36.4 C  Resp: 16    Post-op Vital Signs: stable   Complications: No apparent anesthesia complications

## 2014-01-04 NOTE — Interval H&P Note (Signed)
History and Physical Interval Note:  01/04/2014 3:23 PM  Lisa CommonsJacqueline Wall  has presented today for surgery, with the diagnosis of left hip osteoarthritis  The various methods of treatment have been discussed with the patient and family. After consideration of risks, benefits and other options for treatment, the patient has consented to  Procedure(s): LEFT TOTAL HIP ARTHROPLASTY ANTERIOR APPROACH (Left) as a surgical intervention .  The patient's history has been reviewed, patient examined, no change in status, stable for surgery.  I have reviewed the patient's chart and labs.  Questions were answered to the patient's satisfaction.     Loanne DrillingALUISIO,Arhianna Ebey V

## 2014-01-04 NOTE — Transfer of Care (Signed)
Immediate Anesthesia Transfer of Care Note  Patient: Lisa Wall  Procedure(s) Performed: Procedure(s): LEFT TOTAL HIP ARTHROPLASTY ANTERIOR APPROACH (Left)  Patient Location: PACU  Anesthesia Type:Spinal  Level of Consciousness: awake, alert , oriented and patient cooperative  Airway & Oxygen Therapy: Patient Spontanous Breathing and Patient connected to face mask oxygen  Post-op Assessment: vital signs stable  Post vital signs: stable  Complications: No apparent anesthesia complications  L2 spinal level

## 2014-01-04 NOTE — Anesthesia Preprocedure Evaluation (Signed)
Anesthesia Evaluation  Patient identified by MRN, date of birth, ID band Patient awake    Reviewed: Allergy & Precautions, H&P , NPO status , Patient's Chart, lab work & pertinent test results  Airway Mallampati: II TM Distance: >3 FB Neck ROM: Full    Dental no notable dental hx.    Pulmonary neg pulmonary ROS,  breath sounds clear to auscultation  Pulmonary exam normal       Cardiovascular hypertension, Pt. on medications negative cardio ROS  Rhythm:Regular Rate:Normal     Neuro/Psych negative neurological ROS  negative psych ROS   GI/Hepatic negative GI ROS, Neg liver ROS,   Endo/Other  negative endocrine ROSMorbid obesity  Renal/GU negative Renal ROS  negative genitourinary   Musculoskeletal negative musculoskeletal ROS (+)   Abdominal   Peds negative pediatric ROS (+)  Hematology negative hematology ROS (+)   Anesthesia Other Findings   Reproductive/Obstetrics negative OB ROS                           Anesthesia Physical Anesthesia Plan  ASA: III  Anesthesia Plan: Spinal   Post-op Pain Management:    Induction:   Airway Management Planned: Simple Face Mask  Additional Equipment:   Intra-op Plan:   Post-operative Plan:   Informed Consent: I have reviewed the patients History and Physical, chart, labs and discussed the procedure including the risks, benefits and alternatives for the proposed anesthesia with the patient or authorized representative who has indicated his/her understanding and acceptance.   Dental advisory given  Plan Discussed with: CRNA  Anesthesia Plan Comments:         Anesthesia Quick Evaluation

## 2014-01-04 NOTE — Anesthesia Procedure Notes (Signed)
Spinal  End time: 01/04/2014 4:15 PM Staffing CRNA/Resident: EVANS, JANET E Spinal Block Patient position: sitting Prep: Betadine Patient monitoring: heart rate and continuous pulse ox Approach: midline Location: L3-4 Injection technique: single-shot Needle Needle type: Spinocan  Needle gauge: 22 G Needle length: 9 cm Assessment Sensory level: T8 Additional Notes Pt tolerated well. Expiration of spinal kit checked and within date. Good CSF flow without paresthesia or heme.Marland Kitchen

## 2014-01-04 NOTE — Op Note (Signed)
OPERATIVE REPORT  PREOPERATIVE DIAGNOSIS: Osteoarthritis of the Left hip.   POSTOPERATIVE DIAGNOSIS: Osteoarthritis of the Left  hip.   PROCEDURE: Left total hip arthroplasty, anterior approach.   SURGEON: Ollen GrossFrank Aluisio, MD   ASSISTANT: Avel Peacerew Perkins, PA-C  ANESTHESIA:  Spinal  ESTIMATED BLOOD LOSS:- 300 ml    DRAINS: Hemovac x1.   COMPLICATIONS: None   CONDITION: PACU - hemodynamically stable.   BRIEF CLINICAL NOTE: Lisa Wall is a 68 y.o. female who has advanced end-  stage arthritis of his Left  hip with progressively worsening pain and  dysfunction.The patient has failed nonoperative management and presents for  total hip arthroplasty.   PROCEDURE IN DETAIL: After successful administration of spinal  anesthetic, the traction boots [facility] were [facility] on both  feet and the patient was [facility] the [facility] bed, boots placed into the leg  holders. The Left hip was then isolated from the perineum with plastic  drapes and prepped and draped in the usual sterile fashion. ASIS and  greater trochanter were marked and a oblique incision was made, starting  at about 1 cm lateral and 2 cm distal to the ASIS and coursing towards  the anterior cortex of the femur. The skin was cut with a 10 blade  through subcutaneous tissue to the level of the fascia overlying the  tensor fascia lata muscle. The fascia was then incised in line with the  incision at the junction of the anterior third and posterior 2/3rd. The  muscle was teased off the fascia and then the interval between the TFL  and the rectus was developed. The Hohmann retractor was then placed at  the top of the femoral neck over the capsule. The vessels overlying the  capsule were cauterized and the fat on top of the capsule was removed.  A Hohmann retractor was then placed anterior underneath the rectus  femoris to give exposure to the entire anterior capsule. A T-shaped  capsulotomy was performed.  The edges were tagged and the femoral head  was identified.       Osteophytes are removed off the superior acetabulum.  The femoral neck was then cut in situ with an oscillating saw. Traction  was then applied to the left lower extremity [facility]  traction. The femoral head was then removed. Retractors were placed  around the acetabulum and then circumferential removal of the labrum was  performed. Osteophytes were also removed. Reaming starts at 45 mm to  medialize and  Increased in 2 mm increments to 49 mm. We reamed in  approximately 40 degrees of abduction, 20 degrees anteversion. A 50 mm  pinnacle acetabular shell was then impacted in anatomic position under  fluoroscopic guidance with excellent purchase. We did not need to place  any additional dome screws. A 32 mm neutral + 4 marathon liner was then  placed into the acetabular shell.       The femoral lift was then placed along the lateral aspect of the femur  just distal to the vastus ridge. The leg was  externally rotated and capsule  was stripped off the inferior aspect of the femoral neck down to the  level of the lesser trochanter, this was done with electrocautery. The femur was lifted after this was performed. The  leg was then placed and extended in adducted position to essentially delivering the femur. We also removed the capsule superiorly and the  piriformis from the  piriformis fossa to gain excellent exposure of the  proximal femur. Rongeur was used to remove some cancellous bone to get  into the lateral portion of the proximal femur for placement of the  initial starter reamer. The starter broaches was placed  the starter broach  and was shown to go down the center of the canal. Broaching  with the  Corail system was then performed starting at size 8, coursing  Up to size 11. A size 11 had excellent torsional and rotational  and axial stability. The trial standard offset neck was then placed  with a 32 + 1  trial head. The hip was then reduced. We confirmed that  the stem was in the canal both on AP and lateral x-rays. It also has excellent sizing. The hip was reduced with outstanding stability through full extension, full external rotation,  and then flexion in adduction internal rotation. AP pelvis was taken  and the leg lengths were measured and found to be exactly equal. Hip  was then dislocated again and the femoral head and neck removed. The  femoral broach was removed. Size 11 Corail stem with a standard offset  neck was then impacted into the femur following native anteversion. Has  excellent purchase in the canal. Excellent torsional and rotational and  axial stability. It is confirmed to be in the canal on AP and lateral  fluoroscopic views. The 32 + 1 ceramic head was placed and the hip  reduced with outstanding stability. Again AP pelvis was taken and it  confirmed that the leg lengths were equal. The wound was then copiously  irrigated with saline solution and the capsule reattached and repaired  with Ethibond suture.  20 mL of Exparel mixed with 50 mL of saline then additional 20 ml of .25% Bupivicaine injected into the capsule and into the edge of the tensor fascia lata as well as subcutaneous tissue. The fascia overlying the tensor fascia lata was  then closed with a running #1 V-Loc. Subcu was closed with interrupted  2-0 Vicryl and subcuticular running 4-0 Monocryl. Incision was cleaned  and dried. Steri-Strips and a bulky sterile dressing applied. Hemovac  drain was hooked to suction and then he was awakened and transported to  recovery in stable condition.        Please note that a surgical assistant was a medical necessity for this procedure to perform it in a safe and expeditious manner. Assistant was necessary to provide appropriate retraction of vital neurovascular structures and to prevent femoral fracture and allow for anatomic placement of the prosthesis.  Ollen GrossFrank Aluisio,  M.D.

## 2014-01-05 ENCOUNTER — Encounter (HOSPITAL_COMMUNITY): Payer: Self-pay | Admitting: Orthopedic Surgery

## 2014-01-05 LAB — BASIC METABOLIC PANEL
BUN: 16 mg/dL (ref 6–23)
CO2: 27 mEq/L (ref 19–32)
Calcium: 9.2 mg/dL (ref 8.4–10.5)
Chloride: 99 mEq/L (ref 96–112)
Creatinine, Ser: 0.91 mg/dL (ref 0.50–1.10)
GFR calc Af Amer: 75 mL/min — ABNORMAL LOW (ref >90)
GFR calc non Af Amer: 65 mL/min — ABNORMAL LOW (ref >90)
Glucose, Bld: 135 mg/dL — ABNORMAL HIGH (ref 70–99)
Potassium: 3.7 mEq/L (ref 3.7–5.3)
Sodium: 137 mEq/L (ref 137–147)

## 2014-01-05 LAB — CBC
HCT: 34.9 % — ABNORMAL LOW (ref 36.0–46.0)
Hemoglobin: 11.5 g/dL — ABNORMAL LOW (ref 12.0–15.0)
MCH: 29 pg (ref 26.0–34.0)
MCHC: 33 g/dL (ref 30.0–36.0)
MCV: 88.1 fL (ref 78.0–100.0)
Platelets: 175 10*3/uL (ref 150–400)
RBC: 3.96 MIL/uL (ref 3.87–5.11)
RDW: 13.2 % (ref 11.5–15.5)
WBC: 8.4 10*3/uL (ref 4.0–10.5)

## 2014-01-05 MED ORDER — RIVAROXABAN 10 MG PO TABS: 10.0000 mg | ORAL_TABLET | Freq: Every day | ORAL | Status: DC

## 2014-01-05 MED ORDER — METHOCARBAMOL 500 MG PO TABS: 500.0000 mg | ORAL_TABLET | Freq: Four times a day (QID) | ORAL | Status: DC | PRN

## 2014-01-05 MED ORDER — OXYCODONE HCL 5 MG PO TABS: 5.0000 mg | ORAL_TABLET | ORAL | Status: DC | PRN

## 2014-01-05 MED ORDER — TRAMADOL HCL 50 MG PO TABS: 50.0000 mg | ORAL_TABLET | Freq: Four times a day (QID) | ORAL | Status: DC | PRN

## 2014-01-05 NOTE — Discharge Instructions (Addendum)
°Dr. Frank Aluisio °Total Joint Specialist °[facility] °[address] °[place], [place] [address] °[phone] ° ° ° °ANTERIOR APPROACH TOTAL HIP REPLACEMENT POSTOPERATIVE DIRECTIONS ° ° °Hip Rehabilitation, Guidelines Following Surgery  °The results of a hip operation are greatly improved after range of motion and muscle strengthening exercises. Follow all safety measures which are given to protect your hip. If any of these exercises cause increased pain or swelling in your joint, decrease the amount until you are comfortable again. Then slowly increase the exercises. Call your caregiver if you have problems or questions.  °HOME CARE INSTRUCTIONS  °Most of the following instructions are designed to prevent the dislocation of your new hip.  °Remove items at home which could result in a fall. This includes throw rugs or furniture in walking pathways.  °Continue medications as instructed at time of discharge. °· You may have some home medications which will be placed on hold until you complete the course of blood thinner medication. °· You may start showering once you are discharged home but do not submerge the incision under water. Just pat the incision dry and apply a dry gauze dressing on daily. °Do not put on socks or shoes without following the instructions of your caregivers.  °Sit on high chairs which makes it easier to stand.  °Sit on chairs with arms. Use the chair arms to help push yourself up when arising.  °Keep your leg on the side of the operation out in front of you when standing up.  °Arrange for the use of a toilet seat elevator so you are not sitting low.   °· Walk with walker as instructed.  °You may resume a sexual relationship in one month or when given the OK by your caregiver.  °Use walker as long as suggested by your caregivers.  °You may put full weight on your legs and walk as much as is comfortable. °Avoid periods of inactivity such as sitting longer than an hour  when not asleep. This helps prevent blood clots.  °You may return to work once you are cleared by your surgeon.  °Do not drive a car for 6 weeks or until released by your surgeon.  °Do not drive while taking narcotics.  °Wear elastic stockings for three weeks following surgery during the day but you may remove then at night.  °Make sure you keep all of your appointments after your operation with all of your doctors and caregivers. You should call the office at the above phone number and make an appointment for approximately two weeks after the date of your surgery. °Change the dressing daily and reapply a dry dressing each time. °Please pick up a stool softener and laxative for home use as long as you are requiring pain medications. °· Continue to use ice on the hip for pain and swelling from surgery. You may notice swelling that will progress down to the foot and ankle.  This is normal after  surgery.  Elevate the leg when you are not up walking on it.   °It is important for you to complete the blood thinner medication as prescribed by your doctor. °· Continue to use the breathing machine which will help keep your temperature down.  It is common for your temperature to cycle up and down following surgery, especially at night when you are not up moving around and exerting yourself.  The breathing machine keeps your lungs expanded and your temperature down. ° °RANGE OF MOTION AND STRENGTHENING EXERCISES  °  These exercises are designed to help you keep full movement of your hip joint. Follow your caregiver's or physical therapist's instructions. Perform all exercises about fifteen times, three times per day or as directed. Exercise both hips, even if you have had only one joint replacement. These exercises can be done on a training (exercise) mat, on the floor, on a table or on a bed. Use whatever works the best and is most comfortable for you. Use music or television while you are exercising so that the exercises are  a pleasant break in your day. This will make your life better with the exercises acting as a break in routine you can look forward to.  °Lying on your back, slowly slide your foot toward your buttocks, raising your knee up off the floor. Then slowly slide your foot back down until your leg is straight again.  °Lying on your back spread your legs as far apart as you can without causing discomfort.  °Lying on your side, raise your upper leg and foot straight up from the floor as far as is comfortable. Slowly lower the leg and repeat.  °Lying on your back, tighten up the muscle in the front of your thigh (quadriceps muscles). You can do this by keeping your leg straight and trying to raise your heel off the floor. This helps strengthen the largest muscle supporting your knee.  °Lying on your back, tighten up the muscles of your buttocks both with the legs straight and with the knee bent at a comfortable angle while keeping your heel on the floor.  ° °SKILLED REHAB INSTRUCTIONS: °If the patient is transferred to a skilled rehab facility following release from the hospital, a list of the current medications will be sent to the facility for the patient to continue.  When discharged from the skilled rehab facility, please have the facility set up the patient's Home Health Physical Therapy prior to being released. Also, the skilled facility will be responsible for providing the patient with their medications at time of release from the facility to include their pain medication, the muscle relaxants, and their blood thinner medication. If the patient is still at the rehab facility at time of the two week follow up appointment, the skilled rehab facility will also need to assist the patient in arranging follow up appointment in our office and any transportation needs. ° °MAKE SURE YOU:  °Understand these instructions.  °Will watch your condition.  °Will get help right away if you are not doing well or get worse. ° °Pick up  stool softner and laxative for home. °Do not submerge incision under water. °May shower. °Continue to use ice for pain and swelling from surgery. °Total Hip Protocol. ° °Take Xarelto for two and a half more weeks, then discontinue Xarelto. °Once the patient has completed the blood thinner regimen, then take a Baby 81 mg Aspirin daily for three more weeks. ° °Information on my medicine - XARELTO® (Rivaroxaban) ° °This medication education was reviewed with me or my healthcare representative as part of my discharge preparation.  The pharmacist that spoke with me during my hospital stay was:  Hans Rusher K, RPH ° °Why was Xarelto® prescribed for you? °Xarelto® was prescribed for you to reduce the risk of blood clots forming after orthopedic surgery. The medical term for these abnormal blood clots is venous thromboembolism (VTE). ° °What do you need to know about xarelto® ? °Take your Xarelto® ONCE DAILY at the same time every day. °You may   take it either with or without food. ° °If you have difficulty swallowing the tablet whole, you may crush it and mix in applesauce just prior to taking your dose. ° °Take Xarelto® exactly as prescribed by your doctor and DO NOT stop taking Xarelto® without talking to the doctor who prescribed the medication.  Stopping without other VTE prevention medication to take the place of Xarelto® may increase your risk of developing a clot. ° °After discharge, you should have regular check-up appointments with your healthcare provider that is prescribing your Xarelto®.   ° °What do you do if you miss a dose? °If you miss a dose, take it as soon as you remember on the same day then continue your regularly scheduled once daily regimen the next day. Do not take two doses of Xarelto® on the same day.  ° °Important Safety Information °A possible side effect of Xarelto® is bleeding. You should call your healthcare provider right away if you experience any of the following: °  Bleeding from an  injury or your nose that does not stop. °  Unusual colored urine (red or dark brown) or unusual colored stools (red or black). °  Unusual bruising for unknown reasons. °  A serious fall or if you hit your head (even if there is no bleeding). ° °Some medicines may interact with Xarelto® and might increase your risk of bleeding while on Xarelto®. To help avoid this, consult your healthcare provider or pharmacist prior to using any new prescription or non-prescription medications, including herbals, vitamins, non-steroidal anti-inflammatory drugs (NSAIDs) and supplements. ° °This website has more information on Xarelto®: www.xarelto.com. ° ° °

## 2014-01-05 NOTE — Progress Notes (Signed)
Physical Therapy Treatment Note   01/05/14 1500  PT Visit Information  Last PT Received On 01/05/14  Assistance Needed +1  History of Present Illness Pt is a 68 year old female s/p L direct anterior THA.  PT Time Calculation  PT Start Time 1424  PT Stop Time 1450  PT Time Calculation (min) 26 min  Subjective Data  Subjective Pt ambulated in hallway again this afternoon and performed exercises upon return to bed.  Pt progressing well.  Precautions  Precautions None  Restrictions  Other Position/Activity Restrictions WBAT  Cognition  Arousal/Alertness Awake/alert  Behavior During Therapy WFL for tasks assessed/performed  Overall Cognitive Status Within Functional Limits for tasks assessed  Bed Mobility  Overal bed mobility Needs Assistance  Bed Mobility Sit to Supine  Sit to supine Min assist  General bed mobility comments assist for L LE  Transfers  Overall transfer level Needs assistance  Equipment used Rolling walker (2 wheeled)  Transfers Sit to/from Stand  Sit to Stand Min guard  General transfer comment verbal cues for safety  Ambulation/Gait  Ambulation/Gait assistance Min guard  Ambulation Distance (Feet) 160 Feet  Assistive device Rolling walker (2 wheeled)  Gait Pattern/deviations Step-through pattern;Antalgic;Decreased stance time - left;Trunk flexed  General Gait Details verbal cues for posture and RW distance  PT - End of Session  Activity Tolerance Patient tolerated treatment well  Patient left in bed;with call bell/phone within reach  PT - Assessment/Plan  PT Plan Current plan remains appropriate  PT Frequency 7X/week  Follow Up Recommendations Home health PT  PT equipment Rolling walker with 5" wheels  PT Goal Progression  Progress towards PT goals Progressing toward goals  PT General Charges  $$ ACUTE PT VISIT 1 Procedure  PT Treatments  $Gait Training 8-22 mins  $Therapeutic Exercise 8-22 mins   Zenovia JarredKati Lemyre, PT, DPT 01/05/2014 Pager: [phone]

## 2014-01-05 NOTE — Progress Notes (Addendum)
   Subjective: 1 Day Post-Op Procedure(s) (LRB): LEFT TOTAL HIP ARTHROPLASTY ANTERIOR APPROACH (Left) Patient reports pain as mild.   We will start therapy today.  Plan is to go Home after hospital stay.  Objective: Vital signs in last 24 hours: Temp:  [97.3 F (36.3 C)-98.3 F (36.8 C)] 98 F (36.7 C) (06/18 0541) Pulse Rate:  [62-104] 80 (06/18 0541) Resp:  [14-19] 16 (06/18 0541) BP: (105-149)/(57-80) 117/80 mmHg (06/18 0541) SpO2:  [98 %-100 %] 100 % (06/18 0541) Weight:  [266 lb (120.657 kg)] 266 lb (120.657 kg) (06/17 1945)  Intake/Output from previous day:  Intake/Output Summary (Last 24 hours) at 01/05/14 0657 Last data filed at 01/05/14 [id number]  Gross per 24 hour  Intake   3475 ml  Output   2080 ml  Net   1395 ml    Intake/Output this shift: Total I/O In: 1275 [P.O.:360; I.V.:865; IV Piggyback:50] Out: 1530 [Urine:1325; Drains:205]  Labs:  Recent Labs  01/05/14 0510  HGB 11.5*    Recent Labs  01/05/14 0510  WBC 8.4  RBC 3.96  HCT 34.9*  PLT 175   No results found for this basename: NA, K, CL, CO2, BUN, CREATININE, GLUCOSE, CALCIUM,  in the last 72 hours No results found for this basename: LABPT, INR,  in the last 72 hours  EXAM General - Patient is Alert, Appropriate and Oriented Extremity - Neurologically intact Neurovascular intact No cellulitis present Compartment soft Dressing - dressing C/D/I Motor Function - intact, moving foot and toes well on exam.  Hemovac pulled without difficulty.  Past Medical History  Diagnosis Date  . Hypertension   . Arthritis   . Urinary tract infection 6/15    began antibiotics 12/25/13    Assessment/Plan: 1 Day Post-Op Procedure(s) (LRB): LEFT TOTAL HIP ARTHROPLASTY ANTERIOR APPROACH (Left) Principal Problem:   OA (osteoarthritis) of hip   Advance diet Up with therapy D/C IV fluids Plan for discharge tomorrow  DVT Prophylaxis - Xarelto Weight Bearing As Tolerated left Leg Hemovac  Pulled   ALUISIO,FRANK V

## 2014-01-05 NOTE — Progress Notes (Signed)
Utilization review completed.  

## 2014-01-05 NOTE — Evaluation (Addendum)
Occupational Therapy Evaluation Patient Details Name: Lisa Wall MRN: [id number] DOB: 06/10/1946 Today's Date: 01/05/2014    History of Present Illness Pt is a 68 year old female s/p L direct anterior THA.   Clinical Impression   Pt demonstrates decline in function and safety with ADLs and ADL mobility and would benefit from acute OT services to address impairments to in crease level of function and safety. Pt will have assist and sup from her family upon returning home    Follow Up Recommendations  Home health OT    Equipment Recommendations  3 in 1 bedside comode;Tub/shower seat    Recommendations for Other Services       Precautions / Restrictions Precautions Precautions: None;Anterior Hip Restrictions Other Position/Activity Restrictions: WBAT      Mobility Bed Mobility Overal bed mobility: Needs Assistance Bed Mobility: Sit to Supine       Sit to supine: Min assist   General bed mobility comments: assist for L LE  Transfers Overall transfer level: Needs assistance Equipment used: Rolling walker (2 wheeled) Transfers: Sit to/from Stand Sit to Stand: Min assist         General transfer comment: verbal cues for safety    Balance Overall balance assessment: Needs assistance Sitting-balance support: No upper extremity supported;Feet supported Sitting balance-Leahy Scale: Good     Standing balance support: Bilateral upper extremity supported;Single extremity supported;During functional activity Standing balance-Leahy Scale: Fair                              ADL Overall ADL's : Needs assistance/impaired     Grooming: Wash/dry face;Wash/dry hands;Standing;Min guard   Upper Body Bathing: Set up;Sitting   Lower Body Bathing: Maximal assistance   Upper Body Dressing : Set up;Sitting   Lower Body Dressing: Maximal assistance   Toilet Transfer: Minimal assistance;Comfort height toilet;Grab bars;RW;Cueing for safety Toilet Transfer  Details (indicate cue type and reason): verbal cues for safety Toileting- Clothing Manipulation and Hygiene: Moderate assistance;Sit to/from stand   Tub/ Engineer, structuralhower Transfer: Minimal assistance;Grab bars;3 in 1   Functional mobility during ADLs: Minimal assistance;Cueing for safety;Rolling walker Pt and her son provided with education and dem of ADL A/E for home use       Vision  wears glasses at all times                   Perception Perception Perception Tested?: No   Praxis Praxis Praxis tested?: Not tested    Pertinent Vitals/Pain 4/10 L LE pain, VSS     Hand Dominance Left   Extremity/Trunk Assessment Upper Extremity Assessment Upper Extremity Assessment: Overall WFL for tasks assessed   Lower Extremity Assessment Lower Extremity Assessment: Defer to PT evaluation LLE Deficits / Details: decreased functional hip strength observed   Cervical / Trunk Assessment Cervical / Trunk Assessment: Normal   Communication Communication Communication: No difficulties   Cognition Arousal/Alertness: Awake/alert Behavior During Therapy: WFL for tasks assessed/performed Overall Cognitive Status: Within Functional Limits for tasks assessed                     General Comments   pt very pleasant and cooperative                 Home Living Family/patient expects to be discharged to:: Private residence Living Arrangements: Children Available Help at Discharge: Family Type of Home: House Home Access: Stairs to enter Entergy CorporationEntrance Stairs-Number of Steps: 2-3 Entrance  Stairs-Rails: Right Home Layout: One level     Bathroom Shower/Tub: Producer, television/film/videoWalk-in shower   Bathroom Toilet: Handicapped height     Home Equipment: Cane - single point          Prior Functioning/Environment Level of Independence: Independent             OT Diagnosis: Acute pain   OT Problem List: Decreased knowledge of use of DME or AE;Decreased activity tolerance;Impaired balance (sitting  and/or standing);Impaired sensation   OT Treatment/Interventions: Self-care/ADL training;Therapeutic exercise;Patient/family education;Neuromuscular education;Balance training;Therapeutic activities;DME and/or AE instruction    OT Goals(Current goals can be found in the care plan section) ADL Goals Pt Will Perform Grooming: with supervision;with set-up;standing Pt Will Perform Lower Body Bathing: with mod assist;sitting/lateral leans;sit to/from stand Pt Will Perform Lower Body Dressing: with mod assist;sitting/lateral leans;sit to/from stand Pt Will Transfer to Toilet: with min guard assist;with supervision;regular height toilet;ambulating (3 in 1 over toilet) Pt Will Perform Toileting - Clothing Manipulation and hygiene: with min assist;sit to/from stand Pt Will Perform Tub/Shower Transfer: with min guard assist;with supervision;shower seat;grab bars  OT Frequency: Min 2X/week   Barriers to D/C:    none                     End of Session Equipment Utilized During Treatment: Rolling walker;Other (comment) (3 in 1 over toilet)  Activity Tolerance: Patient tolerated treatment well Patient left: in chair;with call bell/phone within reach;with family/visitor present   Time: 1040-1106 OT Time Calculation (min): 26 min Charges:  OT General Charges $OT Visit: 1 Procedure OT Evaluation $Initial OT Evaluation Tier I: 1 Procedure OT Treatments $Therapeutic Activity: 8-22 mins G-Codes:    Galen ManilaSpencer, Lisa Wall Lisa Wall 01/05/2014, 1:04 PM

## 2014-01-05 NOTE — Discharge Summary (Signed)
Physician Discharge Summary   Patient ID: Lisa Wall MRN: [id number] DOB/AGE: [birth year] 66 y.o.  Admit date: 01/04/2014 Discharge date: 01/06/2014  Primary Diagnosis:  Osteoarthritis of the Left hip.   Admission Diagnoses:  Past Medical History  Diagnosis Date  . Hypertension   . Arthritis   . Urinary tract infection 6/15    began antibiotics 12/25/13   Discharge Diagnoses:   Principal Problem:   OA (osteoarthritis) of hip  Estimated body mass index is 48.64 kg/(m^2) as calculated from the following:   Height as of this encounter: '5\' 2"'  (1.575 m).   Weight as of this encounter: 120.657 kg (266 lb).  Procedure(s) (LRB): LEFT TOTAL HIP ARTHROPLASTY ANTERIOR APPROACH (Left)   Consults: None  HPI: Lisa Wall is a 68 y.o. female who has advanced end-  stage arthritis of his Left hip with progressively worsening pain and  dysfunction.The patient has failed nonoperative management and presents for  total hip arthroplasty.   Laboratory Data: Admission on 01/04/2014, Discharged on 01/06/2014  Component Date Value Ref Range Status  . WBC 01/05/2014 8.4  4.0 - 10.5 K/uL Final  . RBC 01/05/2014 3.96  3.87 - 5.11 MIL/uL Final  . Hemoglobin 01/05/2014 11.5* 12.0 - 15.0 g/dL Final  . HCT 01/05/2014 34.9* 36.0 - 46.0 % Final  . MCV 01/05/2014 88.1  78.0 - 100.0 fL Final  . MCH 01/05/2014 29.0  26.0 - 34.0 pg Final  . MCHC 01/05/2014 33.0  30.0 - 36.0 g/dL Final  . RDW 01/05/2014 13.2  11.5 - 15.5 % Final  . Platelets 01/05/2014 175  150 - 400 K/uL Final  . Sodium 01/05/2014 137  137 - 147 mEq/L Final  . Potassium 01/05/2014 3.7  3.7 - 5.3 mEq/L Final  . Chloride 01/05/2014 99  96 - 112 mEq/L Final  . CO2 01/05/2014 27  19 - 32 mEq/L Final  . Glucose, Bld 01/05/2014 135* 70 - 99 mg/dL Final  . BUN 01/05/2014 16  6 - 23 mg/dL Final  . Creatinine, Ser 01/05/2014 0.91  0.50 - 1.10 mg/dL Final  . Calcium 01/05/2014 9.2  8.4 - 10.5 mg/dL Final  . GFR calc non Af Amer  01/05/2014 65* >90 mL/min Final  . GFR calc Af Amer 01/05/2014 75* >90 mL/min Final   Comment: (NOTE)                          The eGFR has been calculated using the CKD EPI equation.                          This calculation has not been validated in all clinical situations.                          eGFR's persistently <90 mL/min signify possible Chronic Kidney                          Disease.  . WBC 01/06/2014 12.9* 4.0 - 10.5 K/uL Final  . RBC 01/06/2014 3.69* 3.87 - 5.11 MIL/uL Final  . Hemoglobin 01/06/2014 10.7* 12.0 - 15.0 g/dL Final  . HCT 01/06/2014 32.1* 36.0 - 46.0 % Final  . MCV 01/06/2014 87.0  78.0 - 100.0 fL Final  . MCH 01/06/2014 29.0  26.0 - 34.0 pg Final  . MCHC 01/06/2014 33.3  30.0 - 36.0 g/dL Final  . RDW  01/06/2014 13.4  11.5 - 15.5 % Final  . Platelets 01/06/2014 164  150 - 400 K/uL Final  . Sodium 01/06/2014 138  137 - 147 mEq/L Final  . Potassium 01/06/2014 3.6* 3.7 - 5.3 mEq/L Final  . Chloride 01/06/2014 99  96 - 112 mEq/L Final  . CO2 01/06/2014 27  19 - 32 mEq/L Final  . Glucose, Bld 01/06/2014 126* 70 - 99 mg/dL Final  . BUN 01/06/2014 20  6 - 23 mg/dL Final  . Creatinine, Ser 01/06/2014 0.94  0.50 - 1.10 mg/dL Final  . Calcium 01/06/2014 9.3  8.4 - 10.5 mg/dL Final  . GFR calc non Af Amer 01/06/2014 62* >90 mL/min Final  . GFR calc Af Amer 01/06/2014 72* >90 mL/min Final   Comment: (NOTE)                          The eGFR has been calculated using the CKD EPI equation.                          This calculation has not been validated in all clinical situations.                          eGFR's persistently <90 mL/min signify possible Chronic Kidney                          Disease.  Hospital Outpatient Visit on 12/27/2013  Component Date Value Ref Range Status  . aPTT 12/27/2013 28  24 - 37 seconds Final  . Prothrombin Time 12/27/2013 13.2  11.6 - 15.2 seconds Final  . INR 12/27/2013 1.02  0.00 - 1.49 Final  . ABO/RH(D) 12/27/2013 O POS   Final  .  Antibody Screen 12/27/2013 NEG   Final  . Sample Expiration 12/27/2013 01/07/2014   Final  . Color, Urine 12/27/2013 YELLOW  YELLOW Final  . APPearance 12/27/2013 CLEAR  CLEAR Final  . Specific Gravity, Urine 12/27/2013 1.021  1.005 - 1.030 Final  . pH 12/27/2013 5.5  5.0 - 8.0 Final  . Glucose, UA 12/27/2013 NEGATIVE  NEGATIVE mg/dL Final  . Hgb urine dipstick 12/27/2013 SMALL* NEGATIVE Final  . Bilirubin Urine 12/27/2013 NEGATIVE  NEGATIVE Final  . Ketones, ur 12/27/2013 NEGATIVE  NEGATIVE mg/dL Final  . Protein, ur 12/27/2013 NEGATIVE  NEGATIVE mg/dL Final  . Urobilinogen, UA 12/27/2013 1.0  0.0 - 1.0 mg/dL Final  . Nitrite 12/27/2013 NEGATIVE  NEGATIVE Final  . Leukocytes, UA 12/27/2013 SMALL* NEGATIVE Final  . MRSA, PCR 12/27/2013 NEGATIVE  NEGATIVE Final  . Staphylococcus aureus 12/27/2013 POSITIVE* NEGATIVE Final   Comment:                                 The Xpert SA Assay (FDA                          approved for NASAL specimens                          in patients over 47 years of age),                          is one component of  a comprehensive surveillance                          program.  Test performance has                          been validated by [facility] for patients greater                          than or equal to 27 year old.                          It is not intended                          to diagnose infection nor to                          guide or monitor treatment.  . Total Protein 12/27/2013 7.7  6.0 - 8.3 g/dL Final  . Albumin 12/27/2013 3.7  3.5 - 5.2 g/dL Final  . AST 12/27/2013 17  0 - 37 U/L Final  . ALT 12/27/2013 17  0 - 35 U/L Final  . Alkaline Phosphatase 12/27/2013 76  39 - 117 U/L Final  . Total Bilirubin 12/27/2013 0.4  0.3 - 1.2 mg/dL Final  . Bilirubin, Direct 12/27/2013 <0.2  0.0 - 0.3 mg/dL Final  . Indirect Bilirubin 12/27/2013 NOT CALCULATED  0.3 - 0.9 mg/dL Final  . Squamous  Epithelial / LPF 12/27/2013 RARE  RARE Final  . WBC, UA 12/27/2013 0-2  <3 WBC/hpf Final  . RBC / HPF 12/27/2013 0-2  <3 RBC/hpf Final  . Bacteria, UA 12/27/2013 RARE  RARE Final  . ABO/RH(D) 12/27/2013 O POS   Final     X-Rays:Dg Chest 2 View  12/27/2013   CLINICAL DATA:  Preop left hip surgery.  Hypertension.  EXAM: CHEST  2 VIEW  COMPARISON:  None.  FINDINGS: The heart size and mediastinal contours are within normal limits. Both lungs are clear. The visualized skeletal structures are unremarkable.  IMPRESSION: No active cardiopulmonary disease.   Electronically Signed   By: Lajean Manes M.D.   On: 12/27/2013 14:03   Dg Hip Complete Left  12/27/2013   CLINICAL DATA:  Arthritis.  EXAM: LEFT HIP - COMPLETE 2+ VIEW  COMPARISON:  08/10/2013 hip series.  FINDINGS: Degenerative changes lumbar spine, both SI joints, both hips. Degenerative changes about the left hip is particularly severe. No acute abnormality identified. No fracture or dislocation.  IMPRESSION: Severe degenerative changes left hip. No evidence of fracture or dislocation.   Electronically Signed   By: Marcello Moores  Register   On: 12/27/2013 14:05   Dg Pelvis Portable  01/04/2014   CLINICAL DATA:  postop  EXAM: PORTABLE PELVIS 1-2 VIEWS  COMPARISON:  Left hip evaluation 08/10/2013  FINDINGS: Patient is status post total left hip arthroplasty. Hardware intact without evidence of loosening or failure. Postsurgical changes in the surrounding soft tissues. Osteoarthritic changes right hip, native osseous structures otherwise unremarkable.  IMPRESSION: Patient is status post total left hip arthroplasty.   Electronically Signed   By: Margaree Mackintosh M.D.   On: 01/04/2014  18:42   Dg C-arm 61-120 Min-no Report  01/04/2014   CLINICAL DATA: intraop   C-ARM 61-120 MINUTES  Fluoroscopy was utilized by the requesting physician.  No radiographic  interpretation.     EKG:No orders found for this or any previous visit.   Hospital Course: Patient was  admitted to [facility] and taken to the OR and underwent the above state procedure without complications.  Patient tolerated the procedure well and was later transferred to the recovery room and then to the orthopaedic floor for postoperative care.  They were given PO and IV analgesics for pain control following their surgery.  They were given 24 hours of postoperative antibiotics of  Anti-infectives   Start     Dose/Rate Route Frequency Ordered Stop   01/04/14 2200  ceFAZolin (ANCEF) IVPB 2 g/50 mL premix     2 g 100 mL/hr over 30 Minutes Intravenous Every 6 hours 01/04/14 1959 01/05/14 0542   01/04/14 1130  ceFAZolin (ANCEF) 3 g in dextrose 5 % 50 mL IVPB     3 g 160 mL/hr over 30 Minutes Intravenous  Once 01/04/14 1123 01/04/14 1604     and started on DVT prophylaxis in the form of Xarelto.   PT and OT were ordered for total hip protocol.  The patient was allowed to be WBAT with therapy. Discharge planning was consulted to help with postop disposition and equipment needs.  Patient had a decent night on the evening of surgery with only mild pain.  They started to get up OOB with therapy on day one.  Hemovac drain was pulled without difficulty.  Continued to work with therapy into day two.  Dressing was changed on day two and the incision was healing well.   Patient was seen in rounds and was ready to go home.  Discharge home with home health  Diet - Cardiac diet  Follow up - in 2 weeks  Activity - WBAT  Disposition - Home  Condition Upon Discharge - Good  D/C Meds - See DC Summary  DVT Prophylaxis - Xarelto       Discharge Instructions   Call MD / Call 911    Complete by:  As directed   If you experience chest pain or shortness of breath, CALL 911 and be transported to the hospital emergency room.  If you develope a fever above 101 F, pus (white drainage) or increased drainage or redness at the wound, or calf pain, call your surgeon's office.     Change dressing    Complete  by:  As directed   You may change your dressing dressing daily with sterile 4 x 4 inch gauze dressing and paper tape.  Do not submerge the incision under water.     Constipation Prevention    Complete by:  As directed   Drink plenty of fluids.  Prune juice may be helpful.  You may use a stool softener, such as Colace (over the counter) 100 mg twice a day.  Use MiraLax (over the counter) for constipation as needed.     Diet - low sodium heart healthy    Complete by:  As directed      Discharge instructions    Complete by:  As directed   Pick up stool softner and laxative for home. Do not submerge incision under water. May shower. Continue to use ice for pain and swelling from surgery.  Total Hip Protocol.  Take Xarelto for two and a half more weeks,  then discontinue Xarelto. Once the patient has completed the blood thinner regimen, then take a Baby 81 mg Aspirin daily for three more weeks.     Do not sit on low chairs, stoools or toilet seats, as it may be difficult to get up from low surfaces    Complete by:  As directed      Driving restrictions    Complete by:  As directed   No driving until released by the physician.     Increase activity slowly as tolerated    Complete by:  As directed      Lifting restrictions    Complete by:  As directed   No lifting until released by the physician.     Patient may shower    Complete by:  As directed   You may shower without a dressing once there is no drainage.  Do not wash over the wound.  If drainage remains, do not shower until drainage stops.     TED hose    Complete by:  As directed   Use stockings (TED hose) for 3 weeks on both leg(s).  You may remove them at night for sleeping.     Weight bearing as tolerated    Complete by:  As directed             Medication List    STOP taking these medications       cephALEXin 500 MG capsule  Commonly known as:  KEFLEX     HYDROcodone-acetaminophen 5-325 MG per tablet  Commonly known  as:  NORCO/VICODIN      TAKE these medications       acetaminophen 500 MG tablet  Commonly known as:  TYLENOL  Take 1,000 mg by mouth every 6 (six) hours as needed for mild pain.     amLODipine-benazepril 5-10 MG per capsule  Commonly known as:  LOTREL  Take 1 capsule by mouth 2 (two) times daily.     hydrochlorothiazide 25 MG tablet  Commonly known as:  HYDRODIURIL  Take 25 mg by mouth every morning.     methocarbamol 500 MG tablet  Commonly known as:  ROBAXIN  Take 1 tablet (500 mg total) by mouth every 6 (six) hours as needed for muscle spasms.     oxyCODONE 5 MG immediate release tablet  Commonly known as:  Oxy IR/ROXICODONE  Take 1-2 tablets (5-10 mg total) by mouth every 3 (three) hours as needed for moderate pain, severe pain or breakthrough pain.     rivaroxaban 10 MG Tabs tablet  Commonly known as:  XARELTO  - Take 1 tablet (10 mg total) by mouth daily with breakfast. Take Xarelto for two and a half more weeks, then discontinue Xarelto.  - Once the patient has completed the blood thinner regimen, then take a Baby 81 mg Aspirin daily for three more weeks.     traMADol 50 MG tablet  Commonly known as:  ULTRAM  Take 1-2 tablets (50-100 mg total) by mouth every 6 (six) hours as needed (mild to moderate pain).       Follow-up Information   Follow up with Gearlean Alf, MD On 01/19/2014.   Specialty:  Orthopedic Surgery   Contact information:   [address] [place] [address] [phone]       Follow up with [facility].   Contact information:   [address] [place] [address] [phone]       Signed: Arlee Muslim,  PA-C Orthopaedic Surgery 01/17/2014, 8:51 AM

## 2014-01-05 NOTE — Evaluation (Signed)
Physical Therapy Evaluation Patient Details Name: Lisa Wall MRN: [id number] DOB: 02/24/1946 Today's Date: 01/05/2014   History of Present Illness  Pt is a 68 year old female s/p L direct anterior THA.  Clinical Impression  Pt is s/p L THA resulting in the deficits listed below (see PT Problem List).  Pt will benefit from skilled PT to increase their independence and safety with mobility to allow discharge to the venue listed below.  Pt mobilizing well POD #1 and plans to d/c home with family assist.       Follow Up Recommendations Home health PT    Equipment Recommendations  Rolling walker with 5" wheels    Recommendations for Other Services       Precautions / Restrictions Precautions Precautions: None Restrictions Other Position/Activity Restrictions: WBAT      Mobility  Bed Mobility Overal bed mobility: Needs Assistance Bed Mobility: Sit to Supine       Sit to supine: Min assist   General bed mobility comments: assist for L LE  Transfers Overall transfer level: Needs assistance Equipment used: Rolling walker (2 wheeled) Transfers: Sit to/from Stand Sit to Stand: Min guard         General transfer comment: verbal cues for safety  Ambulation/Gait Ambulation/Gait assistance: Min guard Ambulation Distance (Feet): 100 Feet Assistive device: Rolling walker (2 wheeled) Gait Pattern/deviations: Step-through pattern;Decreased step length - right;Antalgic;Trunk flexed     General Gait Details: verbal cues for sequence, RW distance, step length, posture  Stairs            Wheelchair Mobility    Modified Rankin (Stroke Patients Only)       Balance                                             Pertinent Vitals/Pain 4/10 L hip pain, activity to tolerance, returned to supine end of session    Home Living Family/patient expects to be discharged to:: Private residence Living Arrangements: Children Available Help at Discharge:  Family Type of Home: House Home Access: Stairs to enter Entrance Stairs-Rails: Right Entrance Stairs-Number of Steps: 2-3 Home Layout: One level Home Equipment: Cane - single point      Prior Function Level of Independence: Independent               Hand Dominance   Dominant Hand: Left    Extremity/Trunk Assessment               Lower Extremity Assessment: LLE deficits/detail   LLE Deficits / Details: decreased functional hip strength observed     Communication   Communication: No difficulties  Cognition Arousal/Alertness: Awake/alert Behavior During Therapy: WFL for tasks assessed/performed Overall Cognitive Status: Within Functional Limits for tasks assessed                      General Comments      Exercises        Assessment/Plan    PT Assessment Patient needs continued PT services  PT Diagnosis Difficulty walking;Acute pain   PT Problem List Decreased strength;Decreased range of motion;Decreased mobility;Pain;Decreased knowledge of use of DME  PT Treatment Interventions Stair training;Gait training;DME instruction;Functional mobility training;Patient/family education;Therapeutic activities;Therapeutic exercise   PT Goals (Current goals can be found in the Care Plan section) Acute Rehab PT Goals PT Goal Formulation: With patient Time For Goal Achievement:  01/10/14 Potential to Achieve Goals: Good    Frequency 7X/week   Barriers to discharge        Co-evaluation               End of Session   Activity Tolerance: Patient limited by fatigue Patient left: in bed;with call bell/phone within reach;with nursing/sitter in room           Time: 0910-0924 PT Time Calculation (min): 14 min   Charges:   PT Evaluation $Initial PT Evaluation Tier I: 1 Procedure PT Treatments $Gait Training: 8-22 mins   PT G Codes:          LEMYRE,KATHrine E 01/05/2014, 11:51 AM Zenovia JarredKati Lemyre, PT, DPT 01/05/2014 Pager: [phone]

## 2014-01-06 LAB — CBC
HCT: 32.1 % — ABNORMAL LOW (ref 36.0–46.0)
Hemoglobin: 10.7 g/dL — ABNORMAL LOW (ref 12.0–15.0)
MCH: 29 pg (ref 26.0–34.0)
MCHC: 33.3 g/dL (ref 30.0–36.0)
MCV: 87 fL (ref 78.0–100.0)
Platelets: 164 10*3/uL (ref 150–400)
RBC: 3.69 MIL/uL — ABNORMAL LOW (ref 3.87–5.11)
RDW: 13.4 % (ref 11.5–15.5)
WBC: 12.9 10*3/uL — ABNORMAL HIGH (ref 4.0–10.5)

## 2014-01-06 LAB — BASIC METABOLIC PANEL
BUN: 20 mg/dL (ref 6–23)
CO2: 27 mEq/L (ref 19–32)
Calcium: 9.3 mg/dL (ref 8.4–10.5)
Chloride: 99 mEq/L (ref 96–112)
Creatinine, Ser: 0.94 mg/dL (ref 0.50–1.10)
GFR calc Af Amer: 72 mL/min — ABNORMAL LOW (ref >90)
GFR calc non Af Amer: 62 mL/min — ABNORMAL LOW (ref >90)
Glucose, Bld: 126 mg/dL — ABNORMAL HIGH (ref 70–99)
Potassium: 3.6 mEq/L — ABNORMAL LOW (ref 3.7–5.3)
Sodium: 138 mEq/L (ref 137–147)

## 2014-01-06 NOTE — Progress Notes (Signed)
Physical Therapy Treatment Patient Details Name: Herminio CommonsJacqueline Space MRN: [id number] DOB: 04/04/1946 Today's Date: 01/06/2014    History of Present Illness Pt is a 68 year old female s/p L direct anterior THA.    PT Comments    Pt progressing well.  Pt ambulated in hallway, practiced stairs and performed exercises.  Pt likely to d/c home later today.  Follow Up Recommendations  Home health PT     Equipment Recommendations  Rolling walker with 5" wheels    Recommendations for Other Services       Precautions / Restrictions Precautions Precautions: None Restrictions Other Position/Activity Restrictions: WBAT    Mobility  Bed Mobility                  Transfers Overall transfer level: Needs assistance Equipment used: Rolling walker (2 wheeled) Transfers: Sit to/from Stand Sit to Stand: Supervision         General transfer comment: verbal cues for safety  Ambulation/Gait Ambulation/Gait assistance: Supervision Ambulation Distance (Feet): 200 Feet Assistive device: Rolling walker (2 wheeled) Gait Pattern/deviations: Step-through pattern;Decreased step length - right;Trunk flexed;Antalgic     General Gait Details: verbal cues for posture and RW distance   Stairs Stairs: Yes Stairs assistance: Min guard Stair Management: Step to pattern;Backwards;With walker Number of Stairs: 2 General stair comments: verbal cues for sequence, safety, RW placement; performed twice  Wheelchair Mobility    Modified Rankin (Stroke Patients Only)       Balance                                    Cognition Arousal/Alertness: Awake/alert Behavior During Therapy: WFL for tasks assessed/performed Overall Cognitive Status: Within Functional Limits for tasks assessed                      Exercises Total Joint Exercises Ankle Circles/Pumps: AROM;Both;15 reps Quad Sets: AROM;Both;15 reps Gluteal Sets: 15 reps;Both;AROM Towel Squeeze: AROM;Both;15  reps Short Arc Quad: AROM;Left;15 reps Heel Slides: AAROM;Left;15 reps Hip ABduction/ADduction: AAROM;Left;15 reps Long Arc Quad: AROM;Seated;Left;15 reps    General Comments        Pertinent Vitals/Pain Pt reports sore L hip however tolerable, activity to tolerance    Home Living                      Prior Function            PT Goals (current goals can now be found in the care plan section) Progress towards PT goals: Progressing toward goals    Frequency  7X/week    PT Plan Current plan remains appropriate    Co-evaluation             End of Session   Activity Tolerance: Patient tolerated treatment well Patient left: with call bell/phone within reach;in chair     Time: [id number] PT Time Calculation (min): 24 min  Charges:  $Gait Training: 8-22 mins $Therapeutic Exercise: 8-22 mins                    G Codes:      LEMYRE,KATHrine E 01/06/2014, 12:44 PM Zenovia JarredKati Lemyre, PT, DPT 01/06/2014 Pager: [phone]

## 2014-01-06 NOTE — Progress Notes (Signed)
Occupational Therapy Treatment Patient Details Name: Herminio CommonsJacqueline Creech MRN: [id number] DOB: 04/10/1946 Today's Date: 01/06/2014    History of present illness Pt is a 68 year old female s/p L direct anterior THA.   OT comments  Pt making good progress with functional goals. Pt planning to d/c home today  Follow Up Recommendations  Home health OT    Equipment Recommendations  3 in 1 bedside comode;Tub/shower seat    Recommendations for Other Services      Precautions / Restrictions Precautions Precautions: None Restrictions Weight Bearing Restrictions: No Other Position/Activity Restrictions: WBAT       Mobility Bed Mobility Overal bed mobility: Needs Assistance Bed Mobility: Sit to Supine       Sit to supine: Min guard      Transfers Overall transfer level: Needs assistance Equipment used: Rolling walker (2 wheeled) Transfers: Sit to/from Stand Sit to Stand: Supervision         General transfer comment: verbal cues for safety    Balance Overall balance assessment: Needs assistance Sitting-balance support: No upper extremity supported;Feet supported Sitting balance-Leahy Scale: Good     Standing balance support: Bilateral upper extremity supported;Single extremity supported;During functional activity Standing balance-Leahy Scale: Fair                     ADL       Grooming: Therapist, nutritionalWash/dry face;Wash/dry hands;Standing;Min guard       Lower Body Bathing: Moderate assistance           Toilet Transfer: Comfort height toilet;Grab bars;RW;Cueing for safety;Supervision/safety Toilet Transfer Details (indicate cue type and reason): verbal cues for safety Toileting- Clothing Manipulation and Hygiene: Sit to/from stand;Minimal assistance   Tub/ Shower Transfer: Grab bars;3 in 1;Min guard   Functional mobility during ADLs: Min guard;Supervision/safety;Cueing for safety        Vision  wears glasses                   Perception  Perception Perception Tested?: No   Praxis Praxis Praxis tested?: Not tested    Cognition   Behavior During Therapy: [facility] for tasks assessed/performed Overall Cognitive Status: Within Functional Limits for tasks assessed                                                General Comments  Pt very pleasant and cooperative    Pertinent Vitals/ Pain       3/10 hip pain, VSS                                            Prior Functioning/Environment              Frequency Min 2X/week     Progress Toward Goals  OT Goals(current goals can now be found in the care plan section)  Progress towards OT goals: Progressing toward goals     Plan Discharge plan remains appropriate                     End of Session Equipment Utilized During Treatment: Rolling walker;Other (comment) (3 in 1 over toilet)   Activity Tolerance Patient tolerated treatment well   Patient Left in chair;with call bell/phone within reach  Time: [id number] OT Time Calculation (min): 15 min  Charges: OT General Charges $OT Visit: 1 Procedure OT Treatments $Self Care/Home Management : 8-22 mins  Galen ManilaSpencer, Denise Jeanette 01/06/2014, 12:49 PM

## 2014-01-06 NOTE — Progress Notes (Signed)
   Subjective: 2 Days Post-Op Procedure(s) (LRB): LEFT TOTAL HIP ARTHROPLASTY ANTERIOR APPROACH (Left) Patient reports pain as mild.   Patient seen in rounds with Dr. Lequita HaltAluisio. Patient is well, and has had no acute complaints or problems Patient is ready to go home  Objective: Vital signs in last 24 hours: Temp:  [97.7 F (36.5 C)-98.2 F (36.8 C)] 98.2 F (36.8 C) (06/19 0534) Pulse Rate:  [76-87] 87 (06/19 0534) Resp:  [16-17] 16 (06/19 0534) BP: (106-130)/(66-72) 130/72 mmHg (06/19 0534) SpO2:  [92 %-99 %] 92 % (06/19 0534)  Intake/Output from previous day:  Intake/Output Summary (Last 24 hours) at 01/06/14 0956 Last data filed at 01/06/14 0748  Gross per 24 hour  Intake   1375 ml  Output   2025 ml  Net   -650 ml    Intake/Output this shift: Total I/O In: 240 [P.O.:240] Out: -   Labs:  Recent Labs  01/05/14 0510 01/06/14 0415  HGB 11.5* 10.7*    Recent Labs  01/05/14 0510 01/06/14 0415  WBC 8.4 12.9*  RBC 3.96 3.69*  HCT 34.9* 32.1*  PLT 175 164    Recent Labs  01/05/14 0510 01/06/14 0415  NA 137 138  K 3.7 3.6*  CL 99 99  CO2 27 27  BUN 16 20  CREATININE 0.91 0.94  GLUCOSE 135* 126*  CALCIUM 9.2 9.3   No results found for this basename: LABPT, INR,  in the last 72 hours  EXAM: General - Patient is Alert, Appropriate and Oriented Extremity - Neurovascular intact Sensation intact distally Dorsiflexion/Plantar flexion intact Incision - clean, dry, no drainage Motor Function - intact, moving foot and toes well on exam.   Assessment/Plan: 2 Days Post-Op Procedure(s) (LRB): LEFT TOTAL HIP ARTHROPLASTY ANTERIOR APPROACH (Left) Procedure(s) (LRB): LEFT TOTAL HIP ARTHROPLASTY ANTERIOR APPROACH (Left) Past Medical History  Diagnosis Date  . Hypertension   . Arthritis   . Urinary tract infection 6/15    began antibiotics 12/25/13   Principal Problem:   OA (osteoarthritis) of hip  Estimated body mass index is 48.64 kg/(m^2) as calculated  from the following:   Height as of this encounter: 5\' 2"  (1.575 m).   Weight as of this encounter: 120.657 kg (266 lb). Up with therapy Discharge home with home health Diet - Cardiac diet Follow up - in 2 weeks Activity - WBAT Disposition - Home Condition Upon Discharge - Good D/C Meds - See DC Summary DVT Prophylaxis - Xarelto  Avel Peacerew Perkins, PA-C Orthopaedic Surgery 01/06/2014, 9:56 AM

## 2014-01-06 NOTE — Progress Notes (Signed)
CARE MANAGEMENT NOTE 01/06/2014  Patient:  Lisa Wall,Macel   Account Number:  [id number]  Date Initiated:  01/06/2014  Documentation initiated by:  DAVIS,RHONDA  Subjective/Objective Assessment:   LEFT TOTAL HIP ARTHROPLASTY ANTERIOR APPROACH (Left)     Action/Plan:   home   Anticipated DC Date:  01/06/2014   Anticipated DC Plan:  HOME W HOME HEALTH SERVICES  In-house referral  NA      DC Planning Services  CM consult      PAC Choice  NA   Choice offered to / List presented to:  C-1 Patient   DME arranged  BEDSIDE COMMODE  [facility] - [facility]      [facility]     [facility]  [facility]   Status of service:  Completed, signed off Medicare Important Message given?  NA - LOS <3 / Initial given by admissions (If response is "NO", the following Medicare IM given date fields will be blank) Date Medicare IM given:   Date Additional Medicare IM given:    Discharge Disposition:  HOME W HOME HEALTH SERVICES  Per UR Regulation:  Reviewed for med. necessity/level of care/duration of stay  If discussed at Long Length of Stay Meetings, dates discussed:    Comments:  Bjorn LoserRhonda Davis,RN,BSN,CCM

## 2014-04-24 ENCOUNTER — Other Ambulatory Visit: Payer: Self-pay | Admitting: Family Medicine

## 2014-04-24 DIAGNOSIS — E2839 Other primary ovarian failure: Secondary | ICD-10-CM

## 2014-04-27 ENCOUNTER — Ambulatory Visit
Admission: RE | Admit: 2014-04-27 | Discharge: 2014-04-27 | Disposition: A | Discharge status: Home / Self Care | Payer: Medicare PPO | Source: Ambulatory Visit | Attending: Family Medicine | Admitting: Family Medicine

## 2014-04-27 DIAGNOSIS — E2839 Other primary ovarian failure: Secondary | ICD-10-CM

## 2014-08-28 ENCOUNTER — Other Ambulatory Visit (HOSPITAL_COMMUNITY): Payer: Self-pay | Admitting: Family Medicine

## 2014-08-28 DIAGNOSIS — Z1231 Encounter for screening mammogram for malignant neoplasm of breast: Secondary | ICD-10-CM

## 2014-09-22 ENCOUNTER — Ambulatory Visit (HOSPITAL_COMMUNITY)
Admission: RE | Admit: 2014-09-22 | Discharge: 2014-09-22 | Disposition: A | Discharge status: Home / Self Care | Payer: Commercial Managed Care - HMO | Source: Ambulatory Visit | Attending: Family Medicine | Admitting: Family Medicine

## 2014-09-22 DIAGNOSIS — Z1231 Encounter for screening mammogram for malignant neoplasm of breast: Secondary | ICD-10-CM | POA: Diagnosis not present

## 2014-09-22 DIAGNOSIS — Z79899 Other long term (current) drug therapy: Secondary | ICD-10-CM | POA: Diagnosis not present

## 2014-09-22 DIAGNOSIS — Z96642 Presence of left artificial hip joint: Secondary | ICD-10-CM | POA: Diagnosis not present

## 2014-09-22 DIAGNOSIS — M170 Bilateral primary osteoarthritis of knee: Secondary | ICD-10-CM | POA: Diagnosis not present

## 2014-09-22 DIAGNOSIS — E785 Hyperlipidemia, unspecified: Secondary | ICD-10-CM | POA: Diagnosis not present

## 2014-09-22 DIAGNOSIS — M169 Osteoarthritis of hip, unspecified: Secondary | ICD-10-CM | POA: Diagnosis not present

## 2014-09-22 DIAGNOSIS — R8299 Other abnormal findings in urine: Secondary | ICD-10-CM | POA: Diagnosis not present

## 2014-09-22 DIAGNOSIS — R319 Hematuria, unspecified: Secondary | ICD-10-CM | POA: Diagnosis not present

## 2014-09-22 DIAGNOSIS — I10 Essential (primary) hypertension: Secondary | ICD-10-CM | POA: Diagnosis not present

## 2014-09-26 ENCOUNTER — Other Ambulatory Visit: Payer: Self-pay | Admitting: Family Medicine

## 2014-09-26 DIAGNOSIS — R928 Other abnormal and inconclusive findings on diagnostic imaging of breast: Secondary | ICD-10-CM

## 2014-09-29 ENCOUNTER — Ambulatory Visit
Admission: RE | Admit: 2014-09-29 | Discharge: 2014-09-29 | Disposition: A | Discharge status: Home / Self Care | Payer: Commercial Managed Care - HMO | Source: Ambulatory Visit | Attending: Family Medicine | Admitting: Family Medicine

## 2014-09-29 DIAGNOSIS — R928 Other abnormal and inconclusive findings on diagnostic imaging of breast: Secondary | ICD-10-CM

## 2014-09-29 DIAGNOSIS — N6001 Solitary cyst of right breast: Secondary | ICD-10-CM | POA: Diagnosis not present

## 2014-12-19 DIAGNOSIS — Z471 Aftercare following joint replacement surgery: Secondary | ICD-10-CM | POA: Diagnosis not present

## 2014-12-19 DIAGNOSIS — Z96642 Presence of left artificial hip joint: Secondary | ICD-10-CM | POA: Diagnosis not present

## 2015-03-27 DIAGNOSIS — R8290 Unspecified abnormal findings in urine: Secondary | ICD-10-CM | POA: Diagnosis not present

## 2015-03-27 DIAGNOSIS — E785 Hyperlipidemia, unspecified: Secondary | ICD-10-CM | POA: Diagnosis not present

## 2015-03-27 DIAGNOSIS — Z23 Encounter for immunization: Secondary | ICD-10-CM | POA: Diagnosis not present

## 2015-03-27 DIAGNOSIS — R350 Frequency of micturition: Secondary | ICD-10-CM | POA: Diagnosis not present

## 2015-03-27 DIAGNOSIS — R319 Hematuria, unspecified: Secondary | ICD-10-CM | POA: Diagnosis not present

## 2015-03-27 DIAGNOSIS — Z79899 Other long term (current) drug therapy: Secondary | ICD-10-CM | POA: Diagnosis not present

## 2015-03-27 DIAGNOSIS — Z833 Family history of diabetes mellitus: Secondary | ICD-10-CM | POA: Diagnosis not present

## 2015-03-27 DIAGNOSIS — I10 Essential (primary) hypertension: Secondary | ICD-10-CM | POA: Diagnosis not present

## 2015-09-03 ENCOUNTER — Other Ambulatory Visit: Payer: Self-pay

## 2015-09-03 DIAGNOSIS — Z1231 Encounter for screening mammogram for malignant neoplasm of breast: Secondary | ICD-10-CM

## 2015-09-24 ENCOUNTER — Ambulatory Visit
Admission: RE | Admit: 2015-09-24 | Discharge: 2015-09-24 | Disposition: A | Discharge status: Home / Self Care | Payer: Commercial Managed Care - HMO | Source: Ambulatory Visit

## 2015-09-24 DIAGNOSIS — Z1231 Encounter for screening mammogram for malignant neoplasm of breast: Secondary | ICD-10-CM | POA: Diagnosis not present

## 2015-10-03 DIAGNOSIS — E785 Hyperlipidemia, unspecified: Secondary | ICD-10-CM | POA: Diagnosis not present

## 2015-10-03 DIAGNOSIS — Z79899 Other long term (current) drug therapy: Secondary | ICD-10-CM | POA: Diagnosis not present

## 2015-10-03 DIAGNOSIS — I10 Essential (primary) hypertension: Secondary | ICD-10-CM | POA: Diagnosis not present

## 2015-10-03 DIAGNOSIS — Z96642 Presence of left artificial hip joint: Secondary | ICD-10-CM | POA: Diagnosis not present

## 2015-10-03 DIAGNOSIS — R8290 Unspecified abnormal findings in urine: Secondary | ICD-10-CM | POA: Diagnosis not present

## 2015-10-03 DIAGNOSIS — M159 Polyosteoarthritis, unspecified: Secondary | ICD-10-CM | POA: Diagnosis not present

## 2015-10-03 DIAGNOSIS — M5136 Other intervertebral disc degeneration, lumbar region: Secondary | ICD-10-CM | POA: Diagnosis not present

## 2015-10-29 DIAGNOSIS — R8290 Unspecified abnormal findings in urine: Secondary | ICD-10-CM | POA: Diagnosis not present

## 2016-04-07 DIAGNOSIS — Z23 Encounter for immunization: Secondary | ICD-10-CM | POA: Diagnosis not present

## 2016-04-07 DIAGNOSIS — Z79899 Other long term (current) drug therapy: Secondary | ICD-10-CM | POA: Diagnosis not present

## 2016-04-07 DIAGNOSIS — Z7982 Long term (current) use of aspirin: Secondary | ICD-10-CM | POA: Diagnosis not present

## 2016-04-07 DIAGNOSIS — I10 Essential (primary) hypertension: Secondary | ICD-10-CM | POA: Diagnosis not present

## 2016-04-07 DIAGNOSIS — E785 Hyperlipidemia, unspecified: Secondary | ICD-10-CM | POA: Diagnosis not present

## 2016-04-07 DIAGNOSIS — M159 Polyosteoarthritis, unspecified: Secondary | ICD-10-CM | POA: Diagnosis not present

## 2016-04-07 DIAGNOSIS — R8290 Unspecified abnormal findings in urine: Secondary | ICD-10-CM | POA: Diagnosis not present

## 2016-04-14 DIAGNOSIS — Z23 Encounter for immunization: Secondary | ICD-10-CM | POA: Diagnosis not present

## 2016-08-27 ENCOUNTER — Other Ambulatory Visit: Payer: Self-pay | Admitting: Family Medicine

## 2016-08-27 DIAGNOSIS — Z1231 Encounter for screening mammogram for malignant neoplasm of breast: Secondary | ICD-10-CM

## 2016-09-19 DIAGNOSIS — M159 Polyosteoarthritis, unspecified: Secondary | ICD-10-CM | POA: Diagnosis not present

## 2016-09-19 DIAGNOSIS — Z7982 Long term (current) use of aspirin: Secondary | ICD-10-CM | POA: Diagnosis not present

## 2016-09-19 DIAGNOSIS — I10 Essential (primary) hypertension: Secondary | ICD-10-CM | POA: Diagnosis not present

## 2016-09-19 DIAGNOSIS — Z79899 Other long term (current) drug therapy: Secondary | ICD-10-CM | POA: Diagnosis not present

## 2016-09-19 DIAGNOSIS — E785 Hyperlipidemia, unspecified: Secondary | ICD-10-CM | POA: Diagnosis not present

## 2016-09-30 ENCOUNTER — Ambulatory Visit
Admission: RE | Admit: 2016-09-30 | Discharge: 2016-09-30 | Disposition: A | Discharge status: Home / Self Care | Payer: Medicare HMO | Source: Ambulatory Visit | Attending: Family Medicine | Admitting: Family Medicine

## 2016-09-30 ENCOUNTER — Ambulatory Visit: Payer: Commercial Managed Care - HMO

## 2016-09-30 DIAGNOSIS — Z1231 Encounter for screening mammogram for malignant neoplasm of breast: Secondary | ICD-10-CM

## 2016-10-01 ENCOUNTER — Other Ambulatory Visit: Payer: Self-pay | Admitting: Family Medicine

## 2016-10-01 DIAGNOSIS — R928 Other abnormal and inconclusive findings on diagnostic imaging of breast: Secondary | ICD-10-CM

## 2016-10-03 ENCOUNTER — Ambulatory Visit
Admission: RE | Admit: 2016-10-03 | Discharge: 2016-10-03 | Disposition: A | Discharge status: Home / Self Care | Payer: Medicare HMO | Source: Ambulatory Visit | Attending: Family Medicine | Admitting: Family Medicine

## 2016-10-03 DIAGNOSIS — R928 Other abnormal and inconclusive findings on diagnostic imaging of breast: Secondary | ICD-10-CM

## 2016-10-03 DIAGNOSIS — N6001 Solitary cyst of right breast: Secondary | ICD-10-CM | POA: Diagnosis not present

## 2017-03-24 DIAGNOSIS — Z23 Encounter for immunization: Secondary | ICD-10-CM | POA: Diagnosis not present

## 2017-03-24 DIAGNOSIS — I10 Essential (primary) hypertension: Secondary | ICD-10-CM | POA: Diagnosis not present

## 2017-03-24 DIAGNOSIS — E785 Hyperlipidemia, unspecified: Secondary | ICD-10-CM | POA: Diagnosis not present

## 2017-03-24 DIAGNOSIS — M159 Polyosteoarthritis, unspecified: Secondary | ICD-10-CM | POA: Diagnosis not present

## 2017-06-25 ENCOUNTER — Other Ambulatory Visit: Payer: Self-pay | Admitting: Family Medicine

## 2017-06-25 DIAGNOSIS — I10 Essential (primary) hypertension: Secondary | ICD-10-CM | POA: Diagnosis not present

## 2017-06-25 DIAGNOSIS — E2839 Other primary ovarian failure: Secondary | ICD-10-CM

## 2017-06-25 DIAGNOSIS — E785 Hyperlipidemia, unspecified: Secondary | ICD-10-CM | POA: Diagnosis not present

## 2017-06-25 DIAGNOSIS — Z6841 Body Mass Index (BMI) 40.0 and over, adult: Secondary | ICD-10-CM | POA: Diagnosis not present

## 2017-06-25 DIAGNOSIS — Z0000 Encounter for general adult medical examination without abnormal findings: Secondary | ICD-10-CM | POA: Diagnosis not present

## 2017-07-15 ENCOUNTER — Ambulatory Visit
Admission: RE | Admit: 2017-07-15 | Discharge: 2017-07-15 | Disposition: A | Discharge status: Home / Self Care | Payer: Medicare HMO | Source: Ambulatory Visit | Attending: Family Medicine | Admitting: Family Medicine

## 2017-07-15 DIAGNOSIS — E2839 Other primary ovarian failure: Secondary | ICD-10-CM

## 2017-07-15 DIAGNOSIS — Z780 Asymptomatic menopausal state: Secondary | ICD-10-CM | POA: Diagnosis not present

## 2017-08-27 ENCOUNTER — Other Ambulatory Visit: Payer: Self-pay | Admitting: Family Medicine

## 2017-08-27 DIAGNOSIS — Z1231 Encounter for screening mammogram for malignant neoplasm of breast: Secondary | ICD-10-CM

## 2017-10-01 ENCOUNTER — Ambulatory Visit
Admission: RE | Admit: 2017-10-01 | Discharge: 2017-10-01 | Disposition: A | Discharge status: Home / Self Care | Payer: Medicare HMO | Source: Ambulatory Visit | Attending: Family Medicine | Admitting: Family Medicine

## 2017-10-01 DIAGNOSIS — Z1231 Encounter for screening mammogram for malignant neoplasm of breast: Secondary | ICD-10-CM

## 2017-12-29 DIAGNOSIS — M159 Polyosteoarthritis, unspecified: Secondary | ICD-10-CM | POA: Diagnosis not present

## 2017-12-29 DIAGNOSIS — I10 Essential (primary) hypertension: Secondary | ICD-10-CM | POA: Diagnosis not present

## 2017-12-29 DIAGNOSIS — E785 Hyperlipidemia, unspecified: Secondary | ICD-10-CM | POA: Diagnosis not present

## 2018-01-21 IMAGING — MG MM SCREENING BREAST TOMO BILATERAL
8 of 12 series · 8 of 28 positions shown · non-contrast
Comparison: Previous exam(s).

ACR Breast Density Category a: The breast tissue is almost entirely
fatty.

CLINICAL DATA: Screening.

EXAM:
DIGITAL SCREENING BILATERAL MAMMOGRAM WITH 3D TOMO WITH CAD

[R CC]
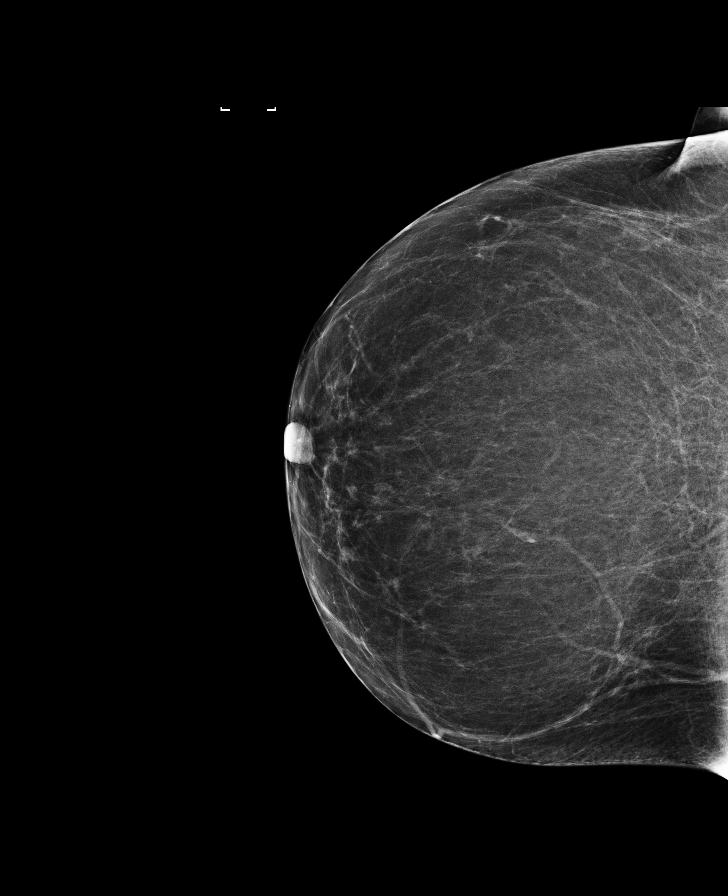

[L MLO]
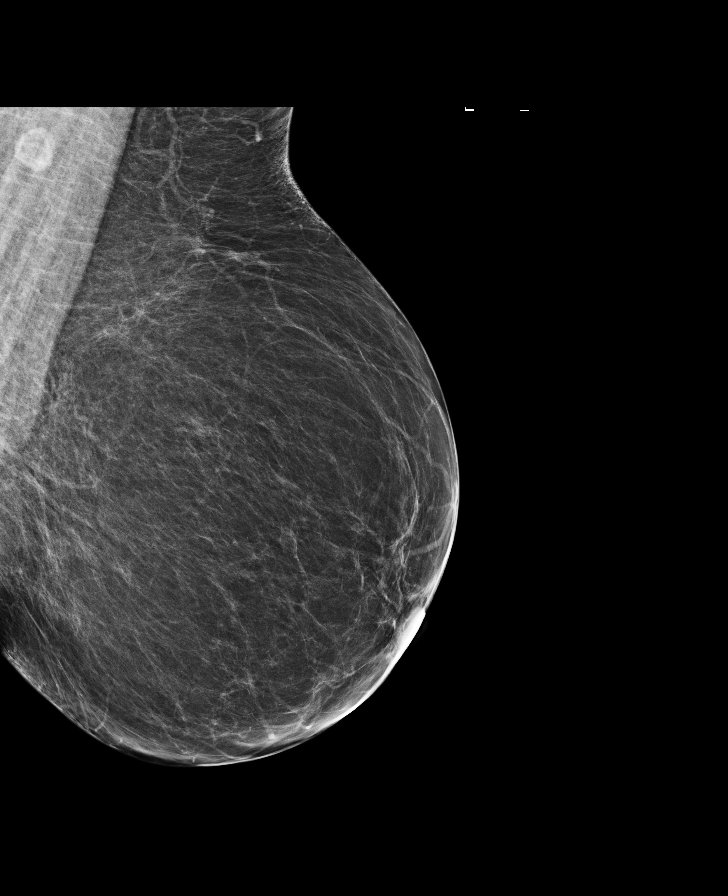

[R MLO synth-2D]
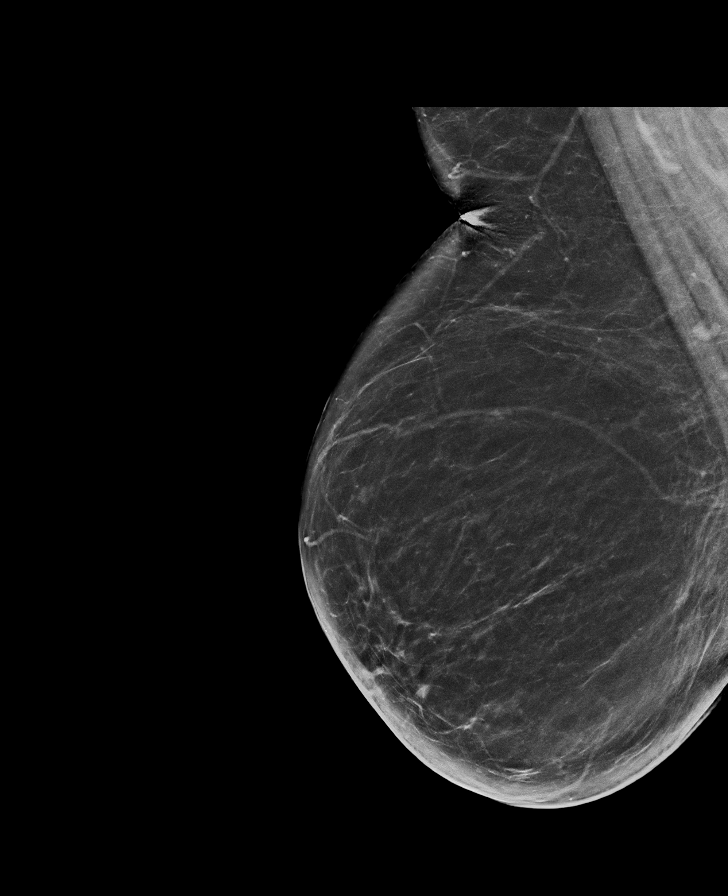

[L CC]
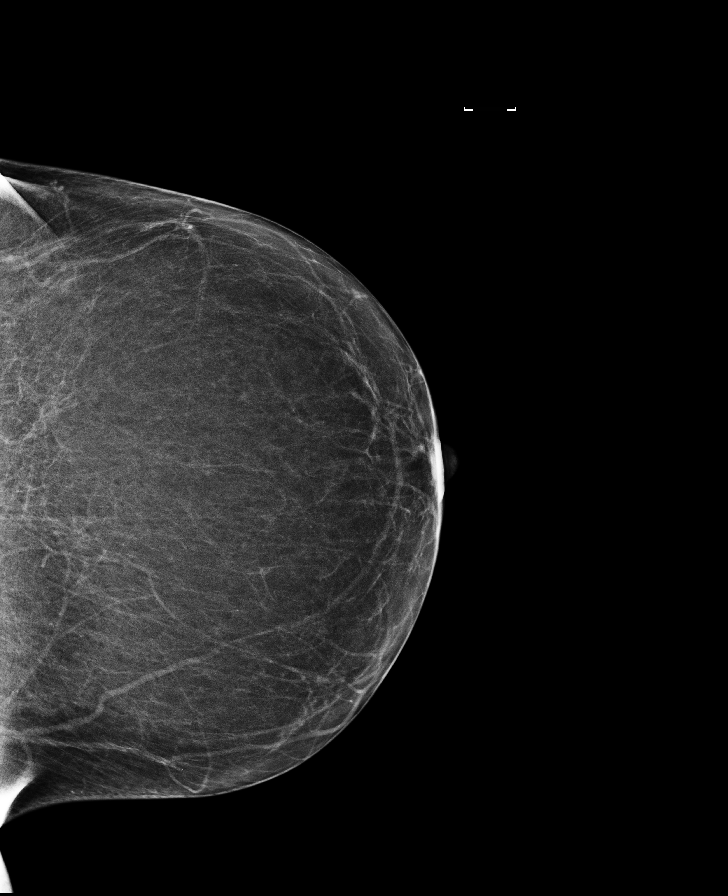

[L CC synth-2D]
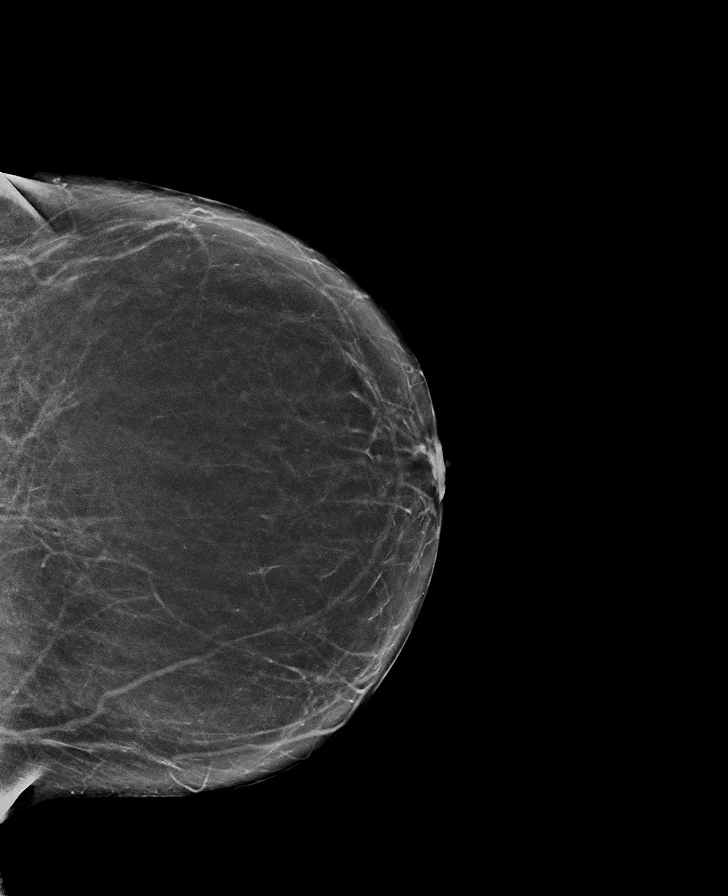

[L MLO synth-2D]
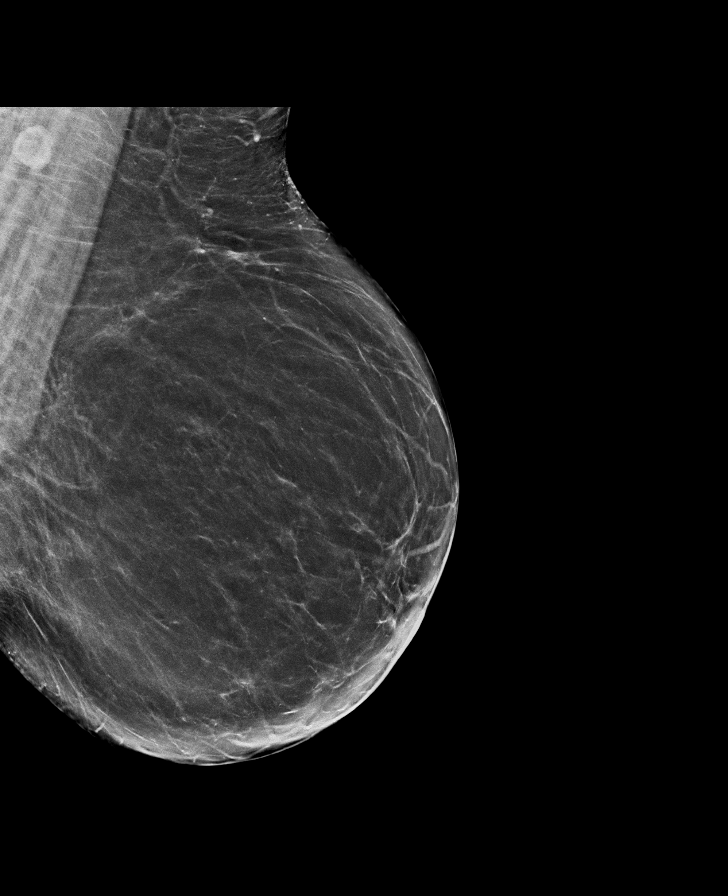

[R CC synth-2D]
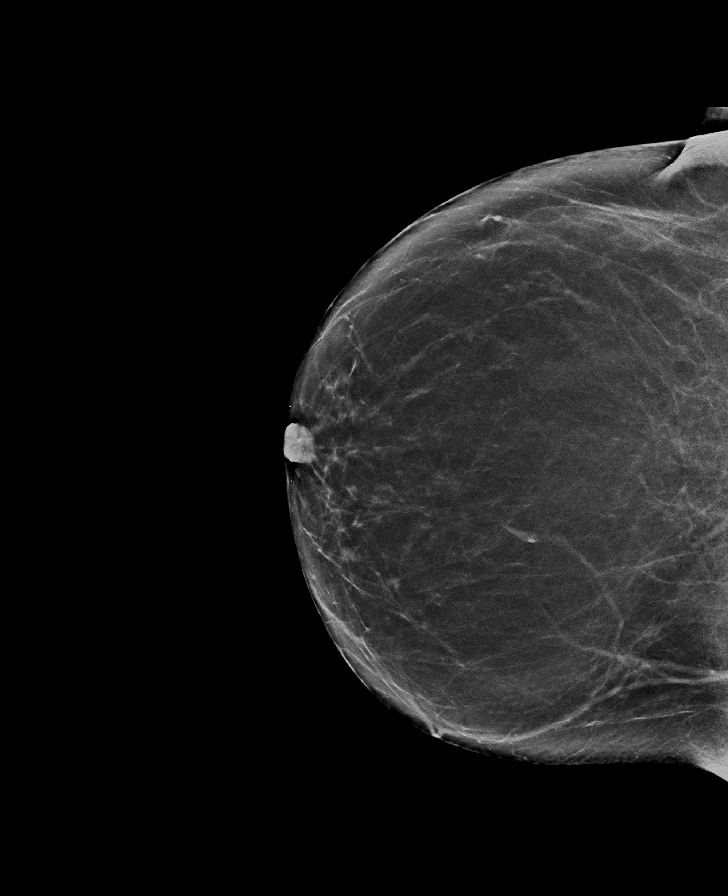

[R MLO]
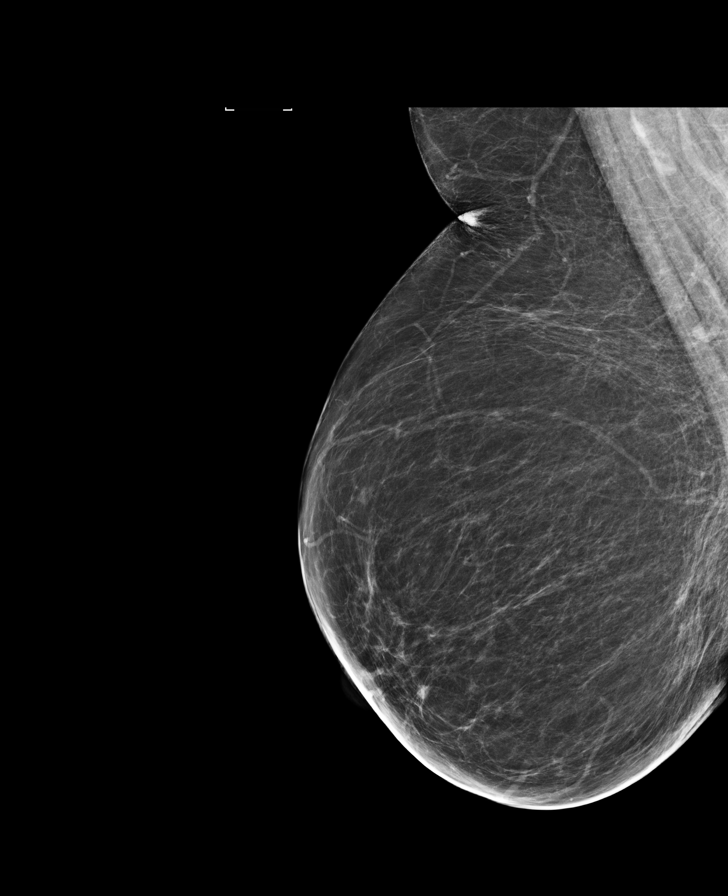

[8 of 28 positions shown; findings below may reference images not displayed]

FINDINGS: There are no findings suspicious for malignancy. Images were
processed with CAD.
IMPRESSION: No mammographic evidence of malignancy. A result letter of this
screening mammogram will be mailed directly to the patient.

RECOMMENDATION:
Screening mammogram in one year. (Code:[id number])

BI-RADS CATEGORY  1: Negative.

## 2018-07-29 DIAGNOSIS — E7800 Pure hypercholesterolemia, unspecified: Secondary | ICD-10-CM | POA: Diagnosis not present

## 2018-07-29 DIAGNOSIS — M5136 Other intervertebral disc degeneration, lumbar region: Secondary | ICD-10-CM | POA: Diagnosis not present

## 2018-07-29 DIAGNOSIS — I10 Essential (primary) hypertension: Secondary | ICD-10-CM | POA: Diagnosis not present

## 2018-08-25 ENCOUNTER — Other Ambulatory Visit: Payer: Self-pay | Admitting: Family Medicine

## 2018-08-25 DIAGNOSIS — Z1231 Encounter for screening mammogram for malignant neoplasm of breast: Secondary | ICD-10-CM

## 2018-10-04 ENCOUNTER — Ambulatory Visit
Admission: RE | Admit: 2018-10-04 | Discharge: 2018-10-04 | Disposition: A | Discharge status: Home / Self Care | Payer: Medicare HMO | Source: Ambulatory Visit | Attending: Family Medicine | Admitting: Family Medicine

## 2018-10-04 ENCOUNTER — Other Ambulatory Visit: Payer: Self-pay

## 2018-10-04 DIAGNOSIS — Z1231 Encounter for screening mammogram for malignant neoplasm of breast: Secondary | ICD-10-CM | POA: Diagnosis not present

## 2019-03-31 DIAGNOSIS — I10 Essential (primary) hypertension: Secondary | ICD-10-CM | POA: Diagnosis not present

## 2019-03-31 DIAGNOSIS — E7800 Pure hypercholesterolemia, unspecified: Secondary | ICD-10-CM | POA: Diagnosis not present

## 2019-03-31 DIAGNOSIS — M5136 Other intervertebral disc degeneration, lumbar region: Secondary | ICD-10-CM | POA: Diagnosis not present

## 2019-04-12 DIAGNOSIS — E7800 Pure hypercholesterolemia, unspecified: Secondary | ICD-10-CM | POA: Diagnosis not present

## 2019-08-16 DIAGNOSIS — Z1389 Encounter for screening for other disorder: Secondary | ICD-10-CM | POA: Diagnosis not present

## 2019-08-16 DIAGNOSIS — Z1159 Encounter for screening for other viral diseases: Secondary | ICD-10-CM | POA: Diagnosis not present

## 2019-08-16 DIAGNOSIS — R609 Edema, unspecified: Secondary | ICD-10-CM | POA: Diagnosis not present

## 2019-08-16 DIAGNOSIS — Z0000 Encounter for general adult medical examination without abnormal findings: Secondary | ICD-10-CM | POA: Diagnosis not present

## 2019-08-16 DIAGNOSIS — E7800 Pure hypercholesterolemia, unspecified: Secondary | ICD-10-CM | POA: Diagnosis not present

## 2019-08-16 DIAGNOSIS — Z1211 Encounter for screening for malignant neoplasm of colon: Secondary | ICD-10-CM | POA: Diagnosis not present

## 2019-08-16 DIAGNOSIS — M5136 Other intervertebral disc degeneration, lumbar region: Secondary | ICD-10-CM | POA: Diagnosis not present

## 2019-08-16 DIAGNOSIS — I10 Essential (primary) hypertension: Secondary | ICD-10-CM | POA: Diagnosis not present

## 2019-08-17 DIAGNOSIS — Z1211 Encounter for screening for malignant neoplasm of colon: Secondary | ICD-10-CM | POA: Diagnosis not present

## 2019-08-24 ENCOUNTER — Other Ambulatory Visit: Payer: Self-pay | Admitting: Family Medicine

## 2019-08-24 DIAGNOSIS — Z1231 Encounter for screening mammogram for malignant neoplasm of breast: Secondary | ICD-10-CM

## 2019-10-05 ENCOUNTER — Ambulatory Visit
Admission: RE | Admit: 2019-10-05 | Discharge: 2019-10-05 | Disposition: A | Discharge status: Home / Self Care | Payer: Medicare HMO | Source: Ambulatory Visit | Attending: Family Medicine | Admitting: Family Medicine

## 2019-10-05 ENCOUNTER — Other Ambulatory Visit: Payer: Self-pay

## 2019-10-05 DIAGNOSIS — Z1231 Encounter for screening mammogram for malignant neoplasm of breast: Secondary | ICD-10-CM

## 2020-02-14 DIAGNOSIS — R609 Edema, unspecified: Secondary | ICD-10-CM | POA: Diagnosis not present

## 2020-02-14 DIAGNOSIS — I10 Essential (primary) hypertension: Secondary | ICD-10-CM | POA: Diagnosis not present

## 2020-02-14 DIAGNOSIS — M5136 Other intervertebral disc degeneration, lumbar region: Secondary | ICD-10-CM | POA: Diagnosis not present

## 2020-02-14 DIAGNOSIS — M25571 Pain in right ankle and joints of right foot: Secondary | ICD-10-CM | POA: Diagnosis not present

## 2020-02-14 DIAGNOSIS — E7800 Pure hypercholesterolemia, unspecified: Secondary | ICD-10-CM | POA: Diagnosis not present

## 2020-05-04 ENCOUNTER — Ambulatory Visit: Payer: Medicare HMO | Attending: Critical Care Medicine

## 2020-05-04 DIAGNOSIS — Z23 Encounter for immunization: Secondary | ICD-10-CM

## 2020-05-04 NOTE — Progress Notes (Signed)
   Covid-19 Vaccination Clinic  Name:  Nanda Bittick    MRN: [id number] DOB: [birth year]  05/04/2020  Ms. Fury was observed post Covid-19 immunization for 15 minutes without incident. She was provided with Vaccine Information Sheet and instruction to access the V-Safe system.   Ms. Kinnaird was instructed to call 911 with any severe reactions post vaccine: Marland Kitchen Difficulty breathing  . Swelling of face and throat  . A fast heartbeat  . A bad rash all over body  . Dizziness and weakness

## 2020-08-31 ENCOUNTER — Other Ambulatory Visit: Payer: Self-pay | Admitting: Family Medicine

## 2020-08-31 DIAGNOSIS — Z1231 Encounter for screening mammogram for malignant neoplasm of breast: Secondary | ICD-10-CM

## 2020-10-22 ENCOUNTER — Ambulatory Visit
Admission: RE | Admit: 2020-10-22 | Discharge: 2020-10-22 | Disposition: A | Discharge status: Home / Self Care | Payer: Medicare HMO | Source: Ambulatory Visit | Attending: Family Medicine | Admitting: Family Medicine

## 2020-10-22 ENCOUNTER — Other Ambulatory Visit: Payer: Self-pay

## 2020-10-22 DIAGNOSIS — Z1231 Encounter for screening mammogram for malignant neoplasm of breast: Secondary | ICD-10-CM

## 2021-04-01 DIAGNOSIS — M5136 Other intervertebral disc degeneration, lumbar region: Secondary | ICD-10-CM | POA: Diagnosis not present

## 2021-04-01 DIAGNOSIS — Z0000 Encounter for general adult medical examination without abnormal findings: Secondary | ICD-10-CM | POA: Diagnosis not present

## 2021-04-01 DIAGNOSIS — R609 Edema, unspecified: Secondary | ICD-10-CM | POA: Diagnosis not present

## 2021-04-01 DIAGNOSIS — Z1389 Encounter for screening for other disorder: Secondary | ICD-10-CM | POA: Diagnosis not present

## 2021-04-01 DIAGNOSIS — I10 Essential (primary) hypertension: Secondary | ICD-10-CM | POA: Diagnosis not present

## 2021-04-01 DIAGNOSIS — E7800 Pure hypercholesterolemia, unspecified: Secondary | ICD-10-CM | POA: Diagnosis not present

## 2021-04-01 DIAGNOSIS — E790 Hyperuricemia without signs of inflammatory arthritis and tophaceous disease: Secondary | ICD-10-CM | POA: Diagnosis not present

## 2021-04-01 DIAGNOSIS — Z23 Encounter for immunization: Secondary | ICD-10-CM | POA: Diagnosis not present

## 2021-04-04 DIAGNOSIS — Z1211 Encounter for screening for malignant neoplasm of colon: Secondary | ICD-10-CM | POA: Diagnosis not present

## 2021-09-20 ENCOUNTER — Other Ambulatory Visit: Payer: Self-pay | Admitting: Family Medicine

## 2021-09-20 DIAGNOSIS — I10 Essential (primary) hypertension: Secondary | ICD-10-CM | POA: Diagnosis not present

## 2021-09-20 DIAGNOSIS — R600 Localized edema: Secondary | ICD-10-CM | POA: Diagnosis not present

## 2021-09-20 DIAGNOSIS — Z6841 Body Mass Index (BMI) 40.0 and over, adult: Secondary | ICD-10-CM | POA: Diagnosis not present

## 2021-09-20 DIAGNOSIS — M5136 Other intervertebral disc degeneration, lumbar region: Secondary | ICD-10-CM | POA: Diagnosis not present

## 2021-09-20 DIAGNOSIS — E790 Hyperuricemia without signs of inflammatory arthritis and tophaceous disease: Secondary | ICD-10-CM | POA: Diagnosis not present

## 2021-09-20 DIAGNOSIS — E7800 Pure hypercholesterolemia, unspecified: Secondary | ICD-10-CM | POA: Diagnosis not present

## 2021-09-20 DIAGNOSIS — Z1231 Encounter for screening mammogram for malignant neoplasm of breast: Secondary | ICD-10-CM

## 2021-10-24 ENCOUNTER — Ambulatory Visit
Admission: RE | Admit: 2021-10-24 | Discharge: 2021-10-24 | Disposition: A | Discharge status: Home / Self Care | Payer: Medicare HMO | Source: Ambulatory Visit | Attending: Family Medicine | Admitting: Family Medicine

## 2021-10-24 DIAGNOSIS — Z1231 Encounter for screening mammogram for malignant neoplasm of breast: Secondary | ICD-10-CM | POA: Diagnosis not present

## 2022-01-30 DIAGNOSIS — H5203 Hypermetropia, bilateral: Secondary | ICD-10-CM | POA: Diagnosis not present

## 2022-01-30 DIAGNOSIS — H2513 Age-related nuclear cataract, bilateral: Secondary | ICD-10-CM | POA: Diagnosis not present

## 2022-01-30 DIAGNOSIS — H524 Presbyopia: Secondary | ICD-10-CM | POA: Diagnosis not present

## 2022-03-11 DIAGNOSIS — H269 Unspecified cataract: Secondary | ICD-10-CM | POA: Diagnosis not present

## 2022-03-11 DIAGNOSIS — H21561 Pupillary abnormality, right eye: Secondary | ICD-10-CM | POA: Diagnosis not present

## 2022-03-11 DIAGNOSIS — Z961 Presence of intraocular lens: Secondary | ICD-10-CM | POA: Diagnosis not present

## 2022-03-11 DIAGNOSIS — H2511 Age-related nuclear cataract, right eye: Secondary | ICD-10-CM | POA: Diagnosis not present

## 2022-04-08 DIAGNOSIS — H269 Unspecified cataract: Secondary | ICD-10-CM | POA: Diagnosis not present

## 2022-04-08 DIAGNOSIS — H2512 Age-related nuclear cataract, left eye: Secondary | ICD-10-CM | POA: Diagnosis not present

## 2022-04-08 DIAGNOSIS — Z961 Presence of intraocular lens: Secondary | ICD-10-CM | POA: Diagnosis not present

## 2022-04-29 DIAGNOSIS — Z1331 Encounter for screening for depression: Secondary | ICD-10-CM | POA: Diagnosis not present

## 2022-04-29 DIAGNOSIS — E790 Hyperuricemia without signs of inflammatory arthritis and tophaceous disease: Secondary | ICD-10-CM | POA: Diagnosis not present

## 2022-04-29 DIAGNOSIS — M5136 Other intervertebral disc degeneration, lumbar region: Secondary | ICD-10-CM | POA: Diagnosis not present

## 2022-04-29 DIAGNOSIS — I10 Essential (primary) hypertension: Secondary | ICD-10-CM | POA: Diagnosis not present

## 2022-04-29 DIAGNOSIS — E7800 Pure hypercholesterolemia, unspecified: Secondary | ICD-10-CM | POA: Diagnosis not present

## 2022-04-29 DIAGNOSIS — Z23 Encounter for immunization: Secondary | ICD-10-CM | POA: Diagnosis not present

## 2022-04-29 DIAGNOSIS — Z0000 Encounter for general adult medical examination without abnormal findings: Secondary | ICD-10-CM | POA: Diagnosis not present

## 2022-04-29 DIAGNOSIS — R609 Edema, unspecified: Secondary | ICD-10-CM | POA: Diagnosis not present

## 2022-05-04 DIAGNOSIS — Z1211 Encounter for screening for malignant neoplasm of colon: Secondary | ICD-10-CM | POA: Diagnosis not present

## 2022-05-05 DIAGNOSIS — E790 Hyperuricemia without signs of inflammatory arthritis and tophaceous disease: Secondary | ICD-10-CM | POA: Diagnosis not present

## 2022-05-05 DIAGNOSIS — E7800 Pure hypercholesterolemia, unspecified: Secondary | ICD-10-CM | POA: Diagnosis not present

## 2022-05-05 DIAGNOSIS — R7989 Other specified abnormal findings of blood chemistry: Secondary | ICD-10-CM | POA: Diagnosis not present

## 2022-05-13 DIAGNOSIS — R946 Abnormal results of thyroid function studies: Secondary | ICD-10-CM | POA: Diagnosis not present

## 2022-06-23 DIAGNOSIS — E0590 Thyrotoxicosis, unspecified without thyrotoxic crisis or storm: Secondary | ICD-10-CM | POA: Diagnosis not present

## 2022-06-23 DIAGNOSIS — Z8349 Family history of other endocrine, nutritional and metabolic diseases: Secondary | ICD-10-CM | POA: Diagnosis not present

## 2022-07-07 DIAGNOSIS — E0590 Thyrotoxicosis, unspecified without thyrotoxic crisis or storm: Secondary | ICD-10-CM | POA: Diagnosis not present

## 2022-07-07 DIAGNOSIS — E0500 Thyrotoxicosis with diffuse goiter without thyrotoxic crisis or storm: Secondary | ICD-10-CM | POA: Diagnosis not present

## 2022-07-22 DIAGNOSIS — E0590 Thyrotoxicosis, unspecified without thyrotoxic crisis or storm: Secondary | ICD-10-CM | POA: Diagnosis not present

## 2022-07-22 DIAGNOSIS — Z8349 Family history of other endocrine, nutritional and metabolic diseases: Secondary | ICD-10-CM | POA: Diagnosis not present

## 2022-07-23 DIAGNOSIS — M1711 Unilateral primary osteoarthritis, right knee: Secondary | ICD-10-CM | POA: Diagnosis not present

## 2022-07-23 DIAGNOSIS — M170 Bilateral primary osteoarthritis of knee: Secondary | ICD-10-CM | POA: Diagnosis not present

## 2022-07-23 DIAGNOSIS — M25562 Pain in left knee: Secondary | ICD-10-CM | POA: Diagnosis not present

## 2022-07-23 DIAGNOSIS — M1712 Unilateral primary osteoarthritis, left knee: Secondary | ICD-10-CM | POA: Diagnosis not present

## 2022-08-05 DIAGNOSIS — E0590 Thyrotoxicosis, unspecified without thyrotoxic crisis or storm: Secondary | ICD-10-CM | POA: Diagnosis not present

## 2022-08-05 DIAGNOSIS — Z8349 Family history of other endocrine, nutritional and metabolic diseases: Secondary | ICD-10-CM | POA: Diagnosis not present

## 2022-08-05 DIAGNOSIS — E0500 Thyrotoxicosis with diffuse goiter without thyrotoxic crisis or storm: Secondary | ICD-10-CM | POA: Diagnosis not present

## 2022-08-11 DIAGNOSIS — E0590 Thyrotoxicosis, unspecified without thyrotoxic crisis or storm: Secondary | ICD-10-CM | POA: Diagnosis not present

## 2022-08-13 DIAGNOSIS — E0500 Thyrotoxicosis with diffuse goiter without thyrotoxic crisis or storm: Secondary | ICD-10-CM | POA: Diagnosis not present

## 2022-08-13 DIAGNOSIS — E0590 Thyrotoxicosis, unspecified without thyrotoxic crisis or storm: Secondary | ICD-10-CM | POA: Diagnosis not present

## 2022-08-13 DIAGNOSIS — Z8349 Family history of other endocrine, nutritional and metabolic diseases: Secondary | ICD-10-CM | POA: Diagnosis not present

## 2022-08-14 ENCOUNTER — Other Ambulatory Visit (HOSPITAL_COMMUNITY): Payer: Self-pay | Admitting: Internal Medicine

## 2022-08-14 DIAGNOSIS — E0590 Thyrotoxicosis, unspecified without thyrotoxic crisis or storm: Secondary | ICD-10-CM

## 2022-08-14 DIAGNOSIS — E0500 Thyrotoxicosis with diffuse goiter without thyrotoxic crisis or storm: Secondary | ICD-10-CM

## 2022-08-25 ENCOUNTER — Encounter (HOSPITAL_COMMUNITY)
Admission: RE | Admit: 2022-08-25 | Discharge: 2022-08-25 | Disposition: A | Discharge status: Home / Self Care | Payer: Medicare HMO | Source: Ambulatory Visit | Attending: Internal Medicine | Admitting: Internal Medicine

## 2022-08-25 DIAGNOSIS — E0500 Thyrotoxicosis with diffuse goiter without thyrotoxic crisis or storm: Secondary | ICD-10-CM | POA: Diagnosis not present

## 2022-08-25 DIAGNOSIS — E0590 Thyrotoxicosis, unspecified without thyrotoxic crisis or storm: Secondary | ICD-10-CM | POA: Insufficient documentation

## 2022-08-25 MED ORDER — SODIUM IODIDE I-123 7.4 MBQ CAPS
463.0000 | ORAL_CAPSULE | Freq: Once | ORAL | Status: AC
  Administered 2022-08-25: 463 via ORAL

## 2022-08-26 ENCOUNTER — Encounter (HOSPITAL_COMMUNITY)
Admission: RE | Admit: 2022-08-26 | Discharge: 2022-08-26 | Disposition: A | Discharge status: Home / Self Care | Payer: Medicare HMO | Source: Ambulatory Visit | Attending: Internal Medicine | Admitting: Internal Medicine

## 2022-08-27 DIAGNOSIS — E042 Nontoxic multinodular goiter: Secondary | ICD-10-CM | POA: Diagnosis not present

## 2022-08-27 DIAGNOSIS — E0500 Thyrotoxicosis with diffuse goiter without thyrotoxic crisis or storm: Secondary | ICD-10-CM | POA: Diagnosis not present

## 2022-09-02 DIAGNOSIS — E0590 Thyrotoxicosis, unspecified without thyrotoxic crisis or storm: Secondary | ICD-10-CM | POA: Diagnosis not present

## 2022-09-04 DIAGNOSIS — E0500 Thyrotoxicosis with diffuse goiter without thyrotoxic crisis or storm: Secondary | ICD-10-CM | POA: Diagnosis not present

## 2022-09-04 DIAGNOSIS — Z8349 Family history of other endocrine, nutritional and metabolic diseases: Secondary | ICD-10-CM | POA: Diagnosis not present

## 2022-09-04 DIAGNOSIS — E0590 Thyrotoxicosis, unspecified without thyrotoxic crisis or storm: Secondary | ICD-10-CM | POA: Diagnosis not present

## 2022-09-10 ENCOUNTER — Ambulatory Visit: Payer: Self-pay | Admitting: Surgery

## 2022-09-10 DIAGNOSIS — E0520 Thyrotoxicosis with toxic multinodular goiter without thyrotoxic crisis or storm: Secondary | ICD-10-CM | POA: Diagnosis not present

## 2022-09-11 ENCOUNTER — Other Ambulatory Visit: Payer: Self-pay | Admitting: Surgery

## 2022-09-11 DIAGNOSIS — E0520 Thyrotoxicosis with toxic multinodular goiter without thyrotoxic crisis or storm: Secondary | ICD-10-CM

## 2022-09-26 ENCOUNTER — Other Ambulatory Visit: Payer: Self-pay | Admitting: Family Medicine

## 2022-09-26 DIAGNOSIS — Z1231 Encounter for screening mammogram for malignant neoplasm of breast: Secondary | ICD-10-CM

## 2022-09-26 NOTE — Patient Instructions (Signed)
DUE TO COVID-19 ONLY TWO VISITORS  (aged 75 and older)  ARE ALLOWED TO COME WITH YOU AND STAY IN THE WAITING ROOM ONLY DURING PRE OP AND PROCEDURE.   **NO VISITORS ARE ALLOWED IN THE SHORT STAY AREA OR RECOVERY ROOM!!**  IF YOU WILL BE ADMITTED INTO THE HOSPITAL YOU ARE ALLOWED ONLY FOUR SUPPORT PEOPLE DURING VISITATION HOURS ONLY (7 AM -8PM)   The support person(s) must pass our screening, gel in and out, and wear a mask at all times, including in the patient's room. Patients must also wear a mask when staff or their support person are in the room. Visitors GUEST BADGE MUST BE WORN VISIBLY  One adult visitor may remain with you overnight and MUST be in the room by 8 P.M.     Your procedure is scheduled on: 10/09/22   Report to [facility] Main Entrance    Report to admitting at : 5:15 AM   Call this number if you have problems the morning of surgery [phone]   Do not eat food :After Midnight.   After Midnight you may have the following liquids until : 4:30 AM DAY OF SURGERY  Water Black Coffee (sugar ok, NO MILK/CREAM OR CREAMERS)  Tea (sugar ok, NO MILK/CREAM OR CREAMERS) regular and decaf                             Plain Jell-O (NO RED)                                           Fruit ices (not with fruit pulp, NO RED)                                     Popsicles (NO RED)                                                                  Juice: apple, WHITE grape, WHITE cranberry Sports drinks like Gatorade (NO RED)             Oral Hygiene is also important to reduce your risk of infection.                                    Remember - BRUSH YOUR TEETH THE MORNING OF SURGERY WITH YOUR REGULAR TOOTHPASTE  DENTURES WILL BE REMOVED PRIOR TO SURGERY PLEASE DO NOT APPLY "Poly grip" OR ADHESIVES!!!   Do NOT smoke after Midnight   Take these medicines the morning of surgery with A SIP OF WATER: amlodipine.                   You may not have any metal on your body  including hair pins, jewelry, and body piercing             Do not wear make-up, lotions, powders, perfumes/cologne, or deodorant  Do not wear nail polish including gel and S&S, artificial/acrylic nails, or any other  type of covering on natural nails including finger and toenails. If you have artificial nails, gel coating, etc. that needs to be removed by a nail salon please have this removed prior to surgery or surgery may need to be canceled/ delayed if the surgeon/ anesthesia feels like they are unable to be safely monitored.   Do not shave  48 hours prior to surgery.    Do not bring valuables to the hospital. [place].   Contacts, glasses, or bridgework may not be worn into surgery.   Bring small overnight bag day of surgery.   DO NOT [place]. PHARMACY WILL DISPENSE MEDICATIONS LISTED ON YOUR MEDICATION LIST TO YOU DURING YOUR ADMISSION [place]!    Patients discharged on the day of surgery will not be allowed to drive home.  Someone NEEDS to stay with you for the first 24 hours after anesthesia.   Special Instructions: Bring a copy of your healthcare power of attorney and living will documents         the day of surgery if you haven't scanned them before.              Please read over the following fact sheets you were given: IF YOU HAVE QUESTIONS ABOUT YOUR PRE-OP INSTRUCTIONS PLEASE CALL [phone]    [facility] - Preparing for Surgery Before surgery, you can play an important role.  Because skin is not sterile, your skin needs to be as free of germs as possible.  You can reduce the number of germs on your skin by washing with CHG (chlorahexidine gluconate) soap before surgery.  CHG is an antiseptic cleaner which kills germs and bonds with the skin to continue killing germs even after washing. Please DO NOT use if you have an allergy to CHG or antibacterial soaps.  If your skin becomes  reddened/irritated stop using the CHG and inform your nurse when you arrive at Short Stay. Do not shave (including legs and underarms) for at least 48 hours prior to the first CHG shower.  You may shave your face/neck. Please follow these instructions carefully:  1.  Shower with CHG Soap the night before surgery and the  morning of Surgery.  2.  If you choose to wash your hair, wash your hair first as usual with your  normal  shampoo.  3.  After you shampoo, rinse your hair and body thoroughly to remove the  shampoo.                           4.  Use CHG as you would any other liquid soap.  You can apply chg directly  to the skin and wash                       Gently with a scrungie or clean washcloth.  5.  Apply the CHG Soap to your body ONLY FROM THE NECK DOWN.   Do not use on face/ open                           Wound or open sores. Avoid contact with eyes, ears mouth and genitals (private parts).  Wash face,  Genitals (private parts) with your normal soap.             6.  Wash thoroughly, paying special attention to the area where your surgery  will be performed.  7.  Thoroughly rinse your body with warm water from the neck down.  8.  DO NOT shower/wash with your normal soap after using and rinsing off  the CHG Soap.                9.  Pat yourself dry with a clean towel.            10.  Wear clean pajamas.            11.  Place clean sheets on your bed the night of your first shower and do not  sleep with pets. Day of Surgery : Do not apply any lotions/deodorants the morning of surgery.  Please wear clean clothes to the hospital/surgery center.  FAILURE TO FOLLOW THESE INSTRUCTIONS MAY RESULT IN THE CANCELLATION OF YOUR SURGERY PATIENT SIGNATURE_________________________________  NURSE SIGNATURE__________________________________  ________________________________________________________________________

## 2022-09-29 ENCOUNTER — Encounter (HOSPITAL_COMMUNITY)
Admission: RE | Admit: 2022-09-29 | Discharge: 2022-09-29 | Disposition: A | Discharge status: Home / Self Care | Payer: Medicare HMO | Source: Ambulatory Visit | Attending: Surgery | Admitting: Surgery

## 2022-09-29 ENCOUNTER — Other Ambulatory Visit: Payer: Self-pay

## 2022-09-29 ENCOUNTER — Encounter (HOSPITAL_COMMUNITY): Payer: Self-pay

## 2022-09-29 ENCOUNTER — Ambulatory Visit (HOSPITAL_COMMUNITY)
Admission: RE | Admit: 2022-09-29 | Discharge: 2022-09-29 | Disposition: A | Discharge status: Home / Self Care | Payer: Medicare HMO | Source: Ambulatory Visit | Attending: Anesthesiology | Admitting: Anesthesiology

## 2022-09-29 VITALS — BP 127/71 | HR 61 | Temp 98.2°F | Ht 62.5 in | Wt 227.4 lb

## 2022-09-29 DIAGNOSIS — Z01818 Encounter for other preprocedural examination: Secondary | ICD-10-CM | POA: Diagnosis not present

## 2022-09-29 DIAGNOSIS — I2510 Atherosclerotic heart disease of native coronary artery without angina pectoris: Secondary | ICD-10-CM | POA: Diagnosis not present

## 2022-09-29 HISTORY — DX: Thyrotoxicosis, unspecified without thyrotoxic crisis or storm: E05.90

## 2022-09-29 LAB — CBC
HCT: 41.4 % (ref 36.0–46.0)
Hemoglobin: 13.5 g/dL (ref 12.0–15.0)
MCH: 29.5 pg (ref 26.0–34.0)
MCHC: 32.6 g/dL (ref 30.0–36.0)
MCV: 90.6 fL (ref 80.0–100.0)
Platelets: 195 10*3/uL (ref 150–400)
RBC: 4.57 MIL/uL (ref 3.87–5.11)
RDW: 14 % (ref 11.5–15.5)
WBC: 6.2 10*3/uL (ref 4.0–10.5)
nRBC: 0 % (ref 0.0–0.2)

## 2022-09-29 LAB — BASIC METABOLIC PANEL
Anion gap: 11 (ref 5–15)
BUN: 19 mg/dL (ref 8–23)
CO2: 28 mmol/L (ref 22–32)
Calcium: 9.4 mg/dL (ref 8.9–10.3)
Chloride: 97 mmol/L — ABNORMAL LOW (ref 98–111)
Creatinine, Ser: 0.91 mg/dL (ref 0.44–1.00)
GFR, Estimated: >60 mL/min (ref >60)
Glucose, Bld: 91 mg/dL (ref 70–99)
Potassium: 3.3 mmol/L — ABNORMAL LOW (ref 3.5–5.1)
Sodium: 136 mmol/L (ref 135–145)

## 2022-09-29 NOTE — Progress Notes (Signed)
For Short Stay: [place] appointment date:  Bowel Prep reminder:   For Anesthesia: PCP - Dr. Antony Contras Cardiologist - N/A  Chest x-ray -  EKG -  Stress Test -  ECHO -  Cardiac Cath -  Pacemaker/ICD device last checked: Pacemaker orders received: Device Rep notified:  Spinal Cord Stimulator:  Sleep Study - N/A CPAP -   Fasting Blood Sugar -  Checks Blood Sugar _____ times a day Date and result of last Hgb A1c-  Last dose of GLP1 agonist-  GLP1 instructions:   Last dose of SGLT-2 inhibitors-  SGLT-2 instructions:   Blood Thinner Instructions: Aspirin Instructions: Last Dose:  Activity level: Can go up a flight of stairs and activities of daily living without stopping and without chest pain and/or shortness of breath   Able to exercise without chest pain and/or shortness of breath  Anesthesia review: Hx: HTN  Patient denies shortness of breath, fever, cough and chest pain at PAT appointment   Patient verbalized understanding of instructions that were given to them at the PAT appointment. Patient was also instructed that they will need to review over the PAT instructions again at home before surgery.

## 2022-10-05 ENCOUNTER — Encounter (HOSPITAL_COMMUNITY): Payer: Self-pay | Admitting: Surgery

## 2022-10-05 DIAGNOSIS — E0520 Thyrotoxicosis with toxic multinodular goiter without thyrotoxic crisis or storm: Secondary | ICD-10-CM | POA: Diagnosis present

## 2022-10-05 NOTE — H&P (Signed)
REFERRING PHYSICIAN: Katina Degree, MD  PROVIDER: Nalin Mazzocco Charlotta Newton, MD   Chief Complaint: New Consultation (Toxic multinodular thyroid)  History of Present Illness:  Patient is referred by Dr. Dagmar Hait for surgical evaluation and management of a toxic multinodular thyroid gland. Patient was diagnosed with hyperthyroidism in 2023. She was started on methimazole which was poorly tolerated and had to be discontinued. Patient had an imaging study with a thyroid uptake scan on August 25, 2022. This demonstrated an abnormally elevated uptake of 36.9% consistent with a toxic multinodular thyroid gland. Patient is now referred for consideration for thyroidectomy for definitive treatment. Patient has no prior history of thyroid disease. She has never been on thyroid medication. She has had no prior head or neck surgery. There is a family history of thyroid disease in the patient's mother who takes daily medication. There is no family history of thyroid malignancy. Patient noted symptoms including tremor, difficulty sleeping, and mild weight loss. Patient is retired.  Review of Systems: A complete review of systems was obtained from the patient. I have reviewed this information and discussed as appropriate with the patient. See HPI as well for other ROS.  Review of Systems Constitutional: Positive for weight loss. HENT: Positive for sore throat. Eyes: Negative. Respiratory: Negative. Cardiovascular: Positive for leg swelling. Gastrointestinal: Negative. Genitourinary: Negative. Musculoskeletal: Positive for neck pain. Skin: Negative. Neurological: Positive for tremors. Endo/Heme/Allergies: Negative. Psychiatric/Behavioral: Negative.   Medical History: Past Medical History: Diagnosis Date Arthritis  Patient Active Problem List Diagnosis Toxic multinodular goiter  Past Surgical History: Procedure Laterality Date REPLACEMENT TOTAL HIP W/ RESURFACING  IMPLANTS   Allergies Allergen Reactions Methimazole Muscle Pain  Current Outpatient Medications on File Prior to Visit Medication Sig Dispense Refill amLODIPine-benazepril (LOTREL) 5-10 mg capsule Take 1 capsule by mouth 2 (two) times daily hydroCHLOROthiazide (HYDRODIURIL) 25 MG tablet Take 25 mg by mouth every morning  No current facility-administered medications on file prior to visit.  History reviewed. No pertinent family history.  Social History  Tobacco Use Smoking Status Never Smokeless Tobacco Never   Social History  Socioeconomic History Marital status: Widowed Tobacco Use Smoking status: Never Smokeless tobacco: Never Substance and Sexual Activity Alcohol use: Not Currently Drug use: Never  Objective:  Vitals: BP: 120/73 Pulse: 71 Temp: 36.6 C (97.9 F) SpO2: 94% Weight: (!) 104.4 kg (230 lb 3.2 oz) Height: 158.8 cm (5' 2.5") PainSc: 0-No pain  Body mass index is 41.43 kg/m.  Physical Exam  GENERAL APPEARANCE Comfortable, no acute issues Development: normal Gross deformities: none  SKIN Rash, lesions, ulcers: none Induration, erythema: none Nodules: none palpable  EYES Conjunctiva and lids: normal Pupils: equal and reactive  EARS, NOSE, MOUTH, THROAT External ears: no lesion or deformity External nose: no lesion or deformity Hearing: grossly normal  NECK Symmetric: yes Trachea: midline Thyroid: Thyroid gland does not appear to be enlarged. It is nodular on palpation. It is mildly tender. There does not appear to be any associated lymphadenopathy.  CHEST Respiratory effort: normal Retraction or accessory muscle use: no Breath sounds: normal bilaterally Rales, rhonchi, wheeze: none  CARDIOVASCULAR Auscultation: regular rhythm, normal rate Murmurs: none Pulses: radial pulse 2+ palpable Lower extremity edema: Bilateral ankle edema  ABDOMEN Not assessed  GENITOURINARY/RECTAL Not assessed  MUSCULOSKELETAL Station and  gait: normal Digits and nails: no clubbing or cyanosis Muscle strength: grossly normal all extremities Range of motion: grossly normal all extremities Deformity: none  LYMPHATIC Cervical: none palpable Supraclavicular: none palpable  PSYCHIATRIC Oriented  to person, place, and time: yes Mood and affect: normal for situation Judgment and insight: appropriate for situation   Assessment and Plan:  Toxic multinodular goiter  Patient is referred by her endocrinologist for surgical evaluation and management of a toxic multinodular thyroid gland.  Patient provided with a copy of "The Thyroid Book: Medical and Surgical Treatment of Thyroid Problems", published by [employer], 16 pages. Book reviewed and explained to patient during visit today.  Today we reviewed her clinical history. We reviewed her laboratory studies as well as her imaging studies. We discussed options for management of hyperthyroidism. Patient has been recommended by her endocrinologist to undergo total thyroidectomy. I agree that this is the proper choice for definitive management. We discussed the procedure at length. We discussed the risk and benefits of thyroid surgery including the risk of recurrent laryngeal nerve injury and injury to parathyroid glands. We discussed the size and location of the surgical incision. We discussed the hospital stay to be anticipated. We discussed her postoperative recovery. We discussed the need for lifelong thyroid hormone replacement. The patient understands and agrees to proceed with surgery in the near future.  Armandina Gemma, MD [facility] [place] practice Office: [phone]

## 2022-10-07 ENCOUNTER — Ambulatory Visit
Admission: RE | Admit: 2022-10-07 | Discharge: 2022-10-07 | Disposition: A | Discharge status: Home / Self Care | Payer: Medicare HMO | Source: Ambulatory Visit | Attending: Surgery | Admitting: Surgery

## 2022-10-07 DIAGNOSIS — E041 Nontoxic single thyroid nodule: Secondary | ICD-10-CM | POA: Diagnosis not present

## 2022-10-07 DIAGNOSIS — E0520 Thyrotoxicosis with toxic multinodular goiter without thyrotoxic crisis or storm: Secondary | ICD-10-CM

## 2022-10-09 ENCOUNTER — Ambulatory Visit (HOSPITAL_COMMUNITY): Payer: Medicare HMO

## 2022-10-09 ENCOUNTER — Encounter (HOSPITAL_COMMUNITY): Payer: Self-pay | Admitting: Surgery

## 2022-10-09 ENCOUNTER — Other Ambulatory Visit: Payer: Self-pay

## 2022-10-09 ENCOUNTER — Ambulatory Visit (HOSPITAL_COMMUNITY)
Admission: RE | Admit: 2022-10-09 | Discharge: 2022-10-10 | Disposition: A | Discharge status: Home / Self Care | Payer: Medicare HMO | Source: Ambulatory Visit | Attending: Surgery | Admitting: Surgery

## 2022-10-09 ENCOUNTER — Ambulatory Visit (HOSPITAL_BASED_OUTPATIENT_CLINIC_OR_DEPARTMENT_OTHER): Payer: Medicare HMO

## 2022-10-09 ENCOUNTER — Encounter (HOSPITAL_COMMUNITY)
Admission: RE | Disposition: A | Discharge status: Home / Self Care | Payer: Self-pay | Source: Ambulatory Visit | Attending: Surgery

## 2022-10-09 DIAGNOSIS — I10 Essential (primary) hypertension: Secondary | ICD-10-CM

## 2022-10-09 DIAGNOSIS — Z6841 Body Mass Index (BMI) 40.0 and over, adult: Secondary | ICD-10-CM

## 2022-10-09 DIAGNOSIS — E0520 Thyrotoxicosis with toxic multinodular goiter without thyrotoxic crisis or storm: Secondary | ICD-10-CM | POA: Diagnosis not present

## 2022-10-09 DIAGNOSIS — E0500 Thyrotoxicosis with diffuse goiter without thyrotoxic crisis or storm: Secondary | ICD-10-CM | POA: Diagnosis not present

## 2022-10-09 DIAGNOSIS — E042 Nontoxic multinodular goiter: Secondary | ICD-10-CM | POA: Diagnosis not present

## 2022-10-09 HISTORY — PX: THYROIDECTOMY: SHX17

## 2022-10-09 SURGERY — THYROIDECTOMY
Anesthesia: General

## 2022-10-09 MED ORDER — HEMOSTATIC AGENTS (NO CHARGE) OPTIME
TOPICAL | Status: DC | PRN
  Administered 2022-10-09: 1 via TOPICAL

## 2022-10-09 MED ORDER — CHLORHEXIDINE GLUCONATE 0.12 % MT SOLN
15.0000 mL | Freq: Once | OROMUCOSAL | Status: AC
  Administered 2022-10-09: 15 mL via OROMUCOSAL

## 2022-10-09 MED ORDER — 0.9 % SODIUM CHLORIDE (POUR BTL) OPTIME
TOPICAL | Status: DC | PRN
  Administered 2022-10-09: 1000 mL

## 2022-10-09 MED ORDER — OXYCODONE HCL 5 MG PO TABS: 5.0000 mg | ORAL_TABLET | Freq: Once | ORAL | Status: DC | PRN

## 2022-10-09 MED ORDER — PHENYLEPHRINE 80 MCG/ML (10ML) SYRINGE FOR IV PUSH (FOR BLOOD PRESSURE SUPPORT)
PREFILLED_SYRINGE | INTRAVENOUS | Status: AC
  Filled 2022-10-09: qty 10

## 2022-10-09 MED ORDER — AMLODIPINE BESYLATE 5 MG PO TABS: 5.0000 mg | ORAL_TABLET | Freq: Every day | ORAL | Status: DC

## 2022-10-09 MED ORDER — ACETAMINOPHEN 325 MG PO TABS
ORAL_TABLET | ORAL | Status: DC | PRN
  Administered 2022-10-09: 1000 mg via ORAL

## 2022-10-09 MED ORDER — TRAMADOL HCL 50 MG PO TABS: 50.0000 mg | ORAL_TABLET | Freq: Four times a day (QID) | ORAL | Status: DC | PRN

## 2022-10-09 MED ORDER — LACTATED RINGERS IV SOLN: INTRAVENOUS | Status: DC

## 2022-10-09 MED ORDER — HYDROMORPHONE HCL 1 MG/ML IJ SOLN
INTRAMUSCULAR | Status: AC
  Filled 2022-10-09: qty 2

## 2022-10-09 MED ORDER — PROPOFOL 10 MG/ML IV BOLUS
INTRAVENOUS | Status: AC
  Filled 2022-10-09: qty 20

## 2022-10-09 MED ORDER — LIDOCAINE 2% (20 MG/ML) 5 ML SYRINGE
INTRAMUSCULAR | Status: DC | PRN
  Administered 2022-10-09: 100 mg via INTRAVENOUS

## 2022-10-09 MED ORDER — DEXAMETHASONE SODIUM PHOSPHATE 4 MG/ML IJ SOLN
INTRAMUSCULAR | Status: DC | PRN
  Administered 2022-10-09: 5 mg via INTRAVENOUS

## 2022-10-09 MED ORDER — EPHEDRINE 5 MG/ML INJ
INTRAVENOUS | Status: AC
  Filled 2022-10-09: qty 5

## 2022-10-09 MED ORDER — ROCURONIUM BROMIDE 10 MG/ML (PF) SYRINGE
PREFILLED_SYRINGE | INTRAVENOUS | Status: DC | PRN
  Administered 2022-10-09: 60 mg via INTRAVENOUS
  Administered 2022-10-09: 20 mg via INTRAVENOUS

## 2022-10-09 MED ORDER — CARBOXYMETHYLCELLULOSE SODIUM 1 % OP GEL: 1.0000 | Freq: Every day | OPHTHALMIC | Status: DC

## 2022-10-09 MED ORDER — ROCURONIUM BROMIDE 10 MG/ML (PF) SYRINGE
PREFILLED_SYRINGE | INTRAVENOUS | Status: AC
  Filled 2022-10-09: qty 10

## 2022-10-09 MED ORDER — BENAZEPRIL HCL 5 MG PO TABS
10.0000 mg | ORAL_TABLET | Freq: Every day | ORAL | Status: DC
  Filled 2022-10-09: qty 2

## 2022-10-09 MED ORDER — ACETAMINOPHEN 650 MG RE SUPP: 650.0000 mg | Freq: Four times a day (QID) | RECTAL | Status: DC | PRN

## 2022-10-09 MED ORDER — ACETAMINOPHEN 500 MG PO TABS
ORAL_TABLET | ORAL | Status: AC
  Filled 2022-10-09: qty 2

## 2022-10-09 MED ORDER — CALCIUM CARBONATE 1250 (500 CA) MG PO TABS
2.0000 | ORAL_TABLET | Freq: Three times a day (TID) | ORAL | Status: DC
  Administered 2022-10-09 – 2022-10-10 (×3): 2500 mg via ORAL
  Filled 2022-10-09 (×3): qty 2

## 2022-10-09 MED ORDER — ORAL CARE MOUTH RINSE: 15.0000 mL | Freq: Once | OROMUCOSAL | Status: AC

## 2022-10-09 MED ORDER — FENTANYL CITRATE (PF) 250 MCG/5ML IJ SOLN
INTRAMUSCULAR | Status: AC
  Filled 2022-10-09: qty 5

## 2022-10-09 MED ORDER — LIDOCAINE HCL (PF) 2 % IJ SOLN
INTRAMUSCULAR | Status: AC
  Filled 2022-10-09: qty 5

## 2022-10-09 MED ORDER — HYDROMORPHONE HCL 1 MG/ML IJ SOLN
0.2500 mg | INTRAMUSCULAR | Status: DC | PRN
  Administered 2022-10-09 (×3): 0.5 mg via INTRAVENOUS

## 2022-10-09 MED ORDER — ONDANSETRON HCL 4 MG/2ML IJ SOLN
INTRAMUSCULAR | Status: AC
  Filled 2022-10-09: qty 2

## 2022-10-09 MED ORDER — DEXAMETHASONE SODIUM PHOSPHATE 10 MG/ML IJ SOLN
INTRAMUSCULAR | Status: AC
  Filled 2022-10-09: qty 1

## 2022-10-09 MED ORDER — CEFAZOLIN SODIUM-DEXTROSE 2-4 GM/100ML-% IV SOLN
2.0000 g | INTRAVENOUS | Status: AC
  Administered 2022-10-09: 2 g via INTRAVENOUS
  Filled 2022-10-09: qty 100

## 2022-10-09 MED ORDER — PROPOFOL 10 MG/ML IV BOLUS
INTRAVENOUS | Status: DC | PRN
  Administered 2022-10-09: 150 mg via INTRAVENOUS

## 2022-10-09 MED ORDER — OXYCODONE HCL 5 MG/5ML PO SOLN: 5.0000 mg | Freq: Once | ORAL | Status: DC | PRN

## 2022-10-09 MED ORDER — ONDANSETRON HCL 4 MG/2ML IJ SOLN: 4.0000 mg | Freq: Once | INTRAMUSCULAR | Status: DC | PRN

## 2022-10-09 MED ORDER — EPHEDRINE SULFATE-NACL 50-0.9 MG/10ML-% IV SOSY
PREFILLED_SYRINGE | INTRAVENOUS | Status: DC | PRN
  Administered 2022-10-09: 5 mg via INTRAVENOUS

## 2022-10-09 MED ORDER — HYDROMORPHONE HCL 1 MG/ML IJ SOLN: 1.0000 mg | INTRAMUSCULAR | Status: DC | PRN

## 2022-10-09 MED ORDER — ONDANSETRON 4 MG PO TBDP: 4.0000 mg | ORAL_TABLET | Freq: Four times a day (QID) | ORAL | Status: DC | PRN

## 2022-10-09 MED ORDER — ACETAMINOPHEN 325 MG PO TABS
650.0000 mg | ORAL_TABLET | Freq: Four times a day (QID) | ORAL | Status: DC | PRN
  Administered 2022-10-10: 650 mg via ORAL
  Filled 2022-10-09: qty 2

## 2022-10-09 MED ORDER — PHENYLEPHRINE 80 MCG/ML (10ML) SYRINGE FOR IV PUSH (FOR BLOOD PRESSURE SUPPORT)
PREFILLED_SYRINGE | INTRAVENOUS | Status: DC | PRN
  Administered 2022-10-09 (×2): 120 ug via INTRAVENOUS

## 2022-10-09 MED ORDER — POLYVINYL ALCOHOL 1.4 % OP SOLN
1.0000 [drp] | Freq: Two times a day (BID) | OPHTHALMIC | Status: DC
  Administered 2022-10-09 (×2): 1 [drp] via OPHTHALMIC
  Filled 2022-10-09: qty 15

## 2022-10-09 MED ORDER — OXYCODONE HCL 5 MG PO TABS: 5.0000 mg | ORAL_TABLET | ORAL | Status: DC | PRN

## 2022-10-09 MED ORDER — ONDANSETRON HCL 4 MG/2ML IJ SOLN
INTRAMUSCULAR | Status: DC | PRN
  Administered 2022-10-09: 4 mg via INTRAVENOUS

## 2022-10-09 MED ORDER — SODIUM CHLORIDE 0.45 % IV SOLN: INTRAVENOUS | Status: DC

## 2022-10-09 MED ORDER — CHLORHEXIDINE GLUCONATE CLOTH 2 % EX PADS: 6.0000 | MEDICATED_PAD | Freq: Once | CUTANEOUS | Status: DC

## 2022-10-09 MED ORDER — FENTANYL CITRATE (PF) 100 MCG/2ML IJ SOLN
INTRAMUSCULAR | Status: DC | PRN
  Administered 2022-10-09: 50 ug via INTRAVENOUS
  Administered 2022-10-09: 100 ug via INTRAVENOUS
  Administered 2022-10-09 (×2): 50 ug via INTRAVENOUS

## 2022-10-09 MED ORDER — HYDROCHLOROTHIAZIDE 25 MG PO TABS: 25.0000 mg | ORAL_TABLET | Freq: Every morning | ORAL | Status: DC

## 2022-10-09 MED ORDER — ONDANSETRON HCL 4 MG/2ML IJ SOLN: 4.0000 mg | Freq: Four times a day (QID) | INTRAMUSCULAR | Status: DC | PRN

## 2022-10-09 MED ORDER — SUGAMMADEX SODIUM 200 MG/2ML IV SOLN
INTRAVENOUS | Status: DC | PRN
  Administered 2022-10-09: 200 mg via INTRAVENOUS

## 2022-10-09 SURGICAL SUPPLY — 32 items
ADH SKN CLS APL DERMABOND .7 (GAUZE/BANDAGES/DRESSINGS) ×1
APL PRP STRL LF DISP 70% ISPRP (MISCELLANEOUS) ×1
ATTRACTOMAT 16X20 MAGNETIC DRP (DRAPES) ×1 IMPLANT
BAG COUNTER SPONGE SURGICOUNT (BAG) ×1 IMPLANT
BAG SPNG CNTER NS LX DISP (BAG) ×1
BLADE SURG 15 STRL LF DISP TIS (BLADE) ×1 IMPLANT
BLADE SURG 15 STRL SS (BLADE) ×1
CHLORAPREP W/TINT 26 (MISCELLANEOUS) ×1 IMPLANT
CLIP TI MEDIUM 6 (CLIP) ×2 IMPLANT
CLIP TI WIDE RED SMALL 6 (CLIP) ×2 IMPLANT
COVER SURGICAL LIGHT HANDLE (MISCELLANEOUS) ×1 IMPLANT
DERMABOND ADVANCED .7 DNX12 (GAUZE/BANDAGES/DRESSINGS) ×1 IMPLANT
DRAPE LAPAROTOMY T 98X78 PEDS (DRAPES) ×1 IMPLANT
DRAPE UTILITY XL STRL (DRAPES) ×1 IMPLANT
ELECT PENCIL ROCKER SW 15FT (MISCELLANEOUS) ×1 IMPLANT
ELECT REM PT RETURN 15FT ADLT (MISCELLANEOUS) ×1 IMPLANT
GAUZE 4X4 16PLY ~~LOC~~+RFID DBL (SPONGE) ×1 IMPLANT
GLOVE SURG ORTHO 8.0 STRL STRW (GLOVE) ×1 IMPLANT
GOWN STRL REUS W/ TWL XL LVL3 (GOWN DISPOSABLE) ×2 IMPLANT
GOWN STRL REUS W/TWL XL LVL3 (GOWN DISPOSABLE) ×2
HEMOSTAT SURGICEL 2X4 FIBR (HEMOSTASIS) ×1 IMPLANT
ILLUMINATOR WAVEGUIDE N/F (MISCELLANEOUS) ×1 IMPLANT
KIT BASIN OR (CUSTOM PROCEDURE TRAY) ×1 IMPLANT
KIT TURNOVER KIT A (KITS) IMPLANT
PACK BASIC VI WITH GOWN DISP (CUSTOM PROCEDURE TRAY) ×1 IMPLANT
SHEARS HARMONIC 9CM CVD (BLADE) ×1 IMPLANT
SUT MNCRL AB 4-0 PS2 18 (SUTURE) ×1 IMPLANT
SUT VIC AB 3-0 SH 18 (SUTURE) ×2 IMPLANT
SYR BULB IRRIG 60ML STRL (SYRINGE) ×1 IMPLANT
TOWEL OR 17X26 10 PK STRL BLUE (TOWEL DISPOSABLE) ×1 IMPLANT
TOWEL OR NON WOVEN STRL DISP B (DISPOSABLE) ×1 IMPLANT
TUBING CONNECTING 10 (TUBING) ×1 IMPLANT

## 2022-10-09 NOTE — Interval H&P Note (Signed)
History and Physical Interval Note:  10/09/2022 8:02 AM  Lisa Wall  has presented today for surgery, with the diagnosis of TOXIC MULTINODULAR THYROID.  The various methods of treatment have been discussed with the patient and family. After consideration of risks, benefits and other options for treatment, the patient has consented to    Procedure(s): TOTAL THYROIDECTOMY (N/A) as a surgical intervention.    The patient's history has been reviewed, patient examined, no change in status, stable for surgery.  I have reviewed the patient's chart and labs.  Questions were answered to the patient's satisfaction.    Armandina Gemma, [place] [facility] [place] practice Office: [place]

## 2022-10-09 NOTE — Transfer of Care (Signed)
Immediate Anesthesia Transfer of Care Note  Patient: Lisa Wall  Procedure(s) Performed: TOTAL THYROIDECTOMY  Patient Location: PACU  Anesthesia Type:General  Level of Consciousness: awake and patient cooperative  Airway & Oxygen Therapy: Patient Spontanous Breathing and Patient connected to face mask  Post-op Assessment: Report given to RN and Post -op Vital signs reviewed and stable  Post vital signs: Reviewed and stable  Last Vitals:  Vitals Value Taken Time  BP 150/72 10/09/22 0941  Temp    Pulse 86 10/09/22 0945  Resp    SpO2 100 % 10/09/22 0945  Vitals shown include unvalidated device data.  Last Pain:  Vitals:   10/09/22 0616  TempSrc:   PainSc: 0-No pain         Complications: No notable events documented.

## 2022-10-09 NOTE — Op Note (Signed)
Procedure Note  Pre-operative Diagnosis:  toxic nodular goiter  Post-operative Diagnosis:  same  Surgeon:  Armandina Gemma, MD  Assistant:  none   Procedure:  Total thyroidectomy  Anesthesia:  General  Estimated Blood Loss:  10 cc  Drains: none         Specimen: thyroid to pathology  Indications:  Patient is referred by Dr. Dagmar Hait for surgical evaluation and management of a toxic multinodular thyroid gland. Patient was diagnosed with hyperthyroidism in 2023. She was started on methimazole which was poorly tolerated and had to be discontinued. Patient had an imaging study with a thyroid uptake scan on August 25, 2022. This demonstrated an abnormally elevated uptake of 36.9% consistent with a toxic multinodular thyroid gland. Patient is now referred for consideration for thyroidectomy for definitive treatment.   Procedure Details: Procedure was done in OR #1 at the [facility]. The patient was brought to the operating room and placed in a supine position on the operating room table. Following administration of general anesthesia, the patient was positioned and then prepped and draped in the usual aseptic fashion. After ascertaining that an adequate level of anesthesia had been achieved, a small Kocher incision was made with #15 blade. Dissection was carried through subcutaneous tissues and platysma.Hemostasis was achieved with the electrocautery. Skin flaps were elevated cephalad and caudad from the thyroid notch to the sternal notch. A [place] self-retaining retractor was placed for exposure. Strap muscles were incised in the midline and dissection was begun on the left side.  Two or three small lymph nodes were present in the midline and were resected and submitted with the specimen.  Strap muscles were reflected laterally.  Left thyroid lobe was mildly enlarged with a large central nodule.  The left lobe was gently mobilized with blunt dissection. Superior pole vessels were  dissected out and divided individually between small and medium ligaclips with the harmonic scalpel. The thyroid lobe was rolled anteriorly. Branches of the inferior thyroid artery were divided between small ligaclips with the harmonic scalpel. Inferior venous tributaries were divided between ligaclips. Both the superior and inferior parathyroid glands were identified and preserved on their vascular pedicles. The recurrent laryngeal nerve was identified and preserved along its course. The ligament of Gwenlyn Found was released with the electrocautery and the gland was mobilized onto the anterior trachea. Isthmus was mobilized across the midline. There was no significant pyramidal lobe present. Dry pack was placed in the left neck.  The right thyroid lobe was gently mobilized with blunt dissection. Right thyroid lobe was small in size. Superior pole vessels were dissected out and divided between small and medium ligaclips with the Harmonic scalpel. Superior parathyroid was identified and preserved. Inferior venous tributaries were divided between medium ligaclips with the harmonic scalpel. The right thyroid lobe was rolled anteriorly and the branches of the inferior thyroid artery divided between small ligaclips. The right recurrent laryngeal nerve was identified and preserved along its course. The ligament of Gwenlyn Found was released with the electrocautery. The right thyroid lobe was mobilized onto the anterior trachea and the remainder of the thyroid was dissected off the anterior trachea and the thyroid was completely excised. A suture was used to mark the left lobe. The entire thyroid gland was submitted to pathology for review.  The neck was irrigated with warm saline. Fibrillar was placed throughout the operative field. Strap muscles were approximated in the midline with interrupted 3-0 Vicryl sutures. Platysma was closed with interrupted 3-0 Vicryl sutures. Skin was closed  with a running 4-0 Monocryl subcuticular  suture. Wound was washed and Dermabond was applied. The patient was awakened from anesthesia and brought to the recovery room. The patient tolerated the procedure well.   Armandina Gemma, [place] [facility] Office: [phone]

## 2022-10-09 NOTE — Anesthesia Preprocedure Evaluation (Signed)
Anesthesia Evaluation  Patient identified by MRN, date of birth, ID band Patient awake    Reviewed: Allergy & Precautions, H&P , NPO status , Patient's Chart, lab work & pertinent test results  Airway Mallampati: II  TM Distance: >3 FB Neck ROM: Full    Dental no notable dental hx.    Pulmonary neg pulmonary ROS   Pulmonary exam normal breath sounds clear to auscultation       Cardiovascular hypertension, Normal cardiovascular exam Rhythm:Regular Rate:Normal     Neuro/Psych negative neurological ROS  negative psych ROS   GI/Hepatic negative GI ROS, Neg liver ROS,,,  Endo/Other   Hyperthyroidism Morbid obesity  Renal/GU negative Renal ROS  negative genitourinary   Musculoskeletal negative musculoskeletal ROS (+)    Abdominal   Peds negative pediatric ROS (+)  Hematology negative hematology ROS (+)   Anesthesia Other Findings   Reproductive/Obstetrics negative OB ROS                             Anesthesia Physical Anesthesia Plan  ASA: 3  Anesthesia Plan: General   Post-op Pain Management: Tylenol PO (pre-op)*   Induction: Intravenous  PONV Risk Score and Plan: 3 and Ondansetron, Dexamethasone and Treatment may vary due to age or medical condition  Airway Management Planned: Oral ETT  Additional Equipment:   Intra-op Plan:   Post-operative Plan: Extubation in OR  Informed Consent: I have reviewed the patients History and Physical, chart, labs and discussed the procedure including the risks, benefits and alternatives for the proposed anesthesia with the patient or authorized representative who has indicated his/her understanding and acceptance.     Dental advisory given  Plan Discussed with: CRNA and Surgeon  Anesthesia Plan Comments:        Anesthesia Quick Evaluation

## 2022-10-09 NOTE — Anesthesia Procedure Notes (Signed)
Procedure Name: Intubation Date/Time: 10/09/2022 8:16 AM  Performed by: Claudia Desanctis, CRNAPre-anesthesia Checklist: Patient identified, Emergency Drugs available, Suction available and Patient being monitored Patient Re-evaluated:Patient Re-evaluated prior to induction Oxygen Delivery Method: Circle system utilized Preoxygenation: Pre-oxygenation with 100% oxygen Induction Type: IV induction Ventilation: Mask ventilation without difficulty Laryngoscope Size: 2 and Miller Grade View: Grade I Tube type: Oral Tube size: 7.0 mm Number of attempts: 1 Airway Equipment and Method: Stylet Placement Confirmation: ETT inserted through vocal cords under direct vision, positive ETCO2 and breath sounds checked- equal and bilateral Secured at: 21 cm Tube secured with: Tape Dental Injury: Teeth and Oropharynx as per pre-operative assessment

## 2022-10-09 NOTE — Anesthesia Postprocedure Evaluation (Signed)
Anesthesia Post Note  Patient: Lisa Wall  Procedure(s) Performed: TOTAL THYROIDECTOMY     Patient location during evaluation: PACU Anesthesia Type: General Level of consciousness: awake and alert Pain management: pain level controlled Vital Signs Assessment: post-procedure vital signs reviewed and stable Respiratory status: spontaneous breathing, nonlabored ventilation, respiratory function stable and patient connected to nasal cannula oxygen Cardiovascular status: blood pressure returned to baseline and stable Postop Assessment: no apparent nausea or vomiting Anesthetic complications: no  No notable events documented.  Last Vitals:  Vitals:   10/09/22 1030 10/09/22 1049  BP:  127/73  Pulse: 84 84  Resp: 19 16  Temp: 36.6 C 36.6 C  SpO2: 100% 98%    Last Pain:  Vitals:   10/09/22 1030  TempSrc:   PainSc: 3                  Olympia Adelsberger S

## 2022-10-10 ENCOUNTER — Encounter (HOSPITAL_COMMUNITY): Payer: Self-pay | Admitting: Surgery

## 2022-10-10 DIAGNOSIS — E0520 Thyrotoxicosis with toxic multinodular goiter without thyrotoxic crisis or storm: Secondary | ICD-10-CM | POA: Diagnosis not present

## 2022-10-10 DIAGNOSIS — Z6841 Body Mass Index (BMI) 40.0 and over, adult: Secondary | ICD-10-CM | POA: Diagnosis not present

## 2022-10-10 DIAGNOSIS — I10 Essential (primary) hypertension: Secondary | ICD-10-CM | POA: Diagnosis not present

## 2022-10-10 LAB — CALCIUM: Calcium: 9.3 mg/dL (ref 8.9–10.3)

## 2022-10-10 MED ORDER — LEVOTHYROXINE SODIUM 88 MCG PO TABS: 88.0000 ug | ORAL_TABLET | Freq: Every day | ORAL | 3 refills | Status: AC

## 2022-10-10 MED ORDER — CALCIUM CARBONATE ANTACID 500 MG PO CHEW: 2.0000 | CHEWABLE_TABLET | Freq: Two times a day (BID) | ORAL | 1 refills | Status: AC

## 2022-10-10 NOTE — Progress Notes (Signed)
Pt was discharged home today. Instructions were reviewed with patient, and questions were answered. Pt was taken to main entrance via wheelchair by NT.  

## 2022-10-10 NOTE — Discharge Instructions (Signed)
[facility] - Dr. Navin Dogan  THYROID & PARATHYROID SURGERY:  POST-OP INSTRUCTIONS  Always review the instruction sheet provided by the hospital nurse at discharge.  A prescription for pain medication may be sent to your pharmacy at the time of discharge.  Take your pain medication as prescribed.  If narcotic pain medicine is not needed, then you may take acetaminophen (Tylenol) or ibuprofen (Advil) as needed for pain or soreness.  Take your normal home medications as prescribed unless otherwise directed.  If you need a refill on your pain medication, please contact the office during regular business hours.  Prescriptions will not be processed by the office after 5:00PM or on weekends.  Start with a light diet upon arrival home, such as soup and crackers or toast.  Be sure to drink plenty of fluids.  Resume your normal diet the day after surgery.  Most patients will experience some swelling and bruising on the chest and neck area.  Ice packs will help for the first 48 hours after arriving home.  Swelling and bruising will take several days to resolve.   It is common to experience some constipation after surgery.  Increasing fluid intake and taking a stool softener (Colace) will usually help to prevent this problem.  A mild laxative (Milk of Magnesia or Miralax) should be taken according to package directions if there has been no bowel movement after 48 hours.  Dermabond glue covers your incision. This seals the wound and you may shower at any time. The Dermabond will remain in place for about a week.  You may gradually remove the glue when it loosens around the edges.  If you need to loosen the Dermabond for removal, apply a layer of Vaseline to the wound for 15 minutes and then remove with a Kleenex. Your sutures are under the skin and will not show - they will dissolve on their own.  You may resume light daily activities beginning the day after discharge (such as self-care,  walking, climbing stairs), gradually increasing activities as tolerated. You may have sexual intercourse when it is comfortable. Refrain from any heavy lifting or straining until approved by your doctor. You may drive when you no longer are taking prescription pain medication, you can comfortably wear a seatbelt, and you can safely maneuver your car and apply the brakes.  You will see your doctor in the office for a follow-up appointment approximately three weeks after your surgery.  Make sure that you call for this appointment within a day or two after you arrive home to insure a convenient appointment time. Please have any requested laboratory tests performed a few days prior to your office visit so that the results will be available at your follow up appointment.  WHEN TO CALL THE [facility] OFFICE: -- Fever greater than 101.5 -- Inability to urinate -- Nausea and/or vomiting - persistent -- Extreme swelling or bruising -- Continued bleeding from incision -- Increased pain, redness, or drainage from the incision -- Difficulty swallowing or breathing -- Muscle cramping or spasms -- Numbness or tingling in hands or around lips  The clinic staff is available to answer your questions during regular business hours.  Please don't hesitate to call and ask to speak to one of the nurses if you have concerns.  [facility] OFFICE: [phone] (24 hours)  Please sign up for MyChart accounts. This will allow you to communicate directly with my nurse or myself without having to call the office. It will also allow you   to view your test results. You will need to enroll in MyChart for my office ([facility]) and for the hospital ([place]).  Janasia Coverdale, MD [facility] A [facility] practice 

## 2022-10-10 NOTE — Discharge Summary (Signed)
    Physician Discharge Summary   Patient ID: Lisa Wall MRN: [id number] DOB/AGE: 1945-08-03 77 y.o.  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  Post op course was uncomplicated.  Pain was well controlled.  Tolerated diet.  Post op calcium level on morning following surgery was 9.3 mg/dl.  Patient was prepared for discharge home on POD#1.  Consults: None  Treatments: surgery: total thyroidectomy  Discharge Exam: Blood pressure 128/64, pulse 75, temperature 98.3 F (36.8 C), temperature source Oral, resp. rate 18, height 5\' 2"  (1.575 m), weight 107.9 kg, SpO2 98 %. HEENT - clear Neck - wound dry and intact; Dermabond in place; mild STS; voice normal  Disposition: Home  Discharge Instructions     Diet - low sodium heart healthy   Complete by: As directed    Increase activity slowly   Complete by: As directed    No dressing needed   Complete by: As directed       Allergies as of 10/10/2022       Reactions   Methimazole Other (See Comments)   Muscle pain        Medication List     TAKE these medications    acetaminophen 500 MG tablet Commonly known as: TYLENOL Take 1,000 mg by mouth every 6 (six) hours as needed for mild pain.   amLODipine-benazepril 5-10 MG capsule Commonly known as: LOTREL Take 1 capsule by mouth daily.   calcium carbonate 500 MG chewable tablet Commonly known as: Tums Chew 2 tablets (400 mg of elemental calcium total) by mouth 2 (two) times daily.   hydrochlorothiazide 25 MG tablet Commonly known as: HYDRODIURIL Take 25 mg by mouth every morning.   levothyroxine 88 MCG tablet Commonly known as: Synthroid Take 1 tablet (88 mcg total) by mouth daily before breakfast.    multivitamin with minerals Tabs tablet Take 1 tablet by mouth 2 (two) times a week.   Refresh Tears 0.5 % Soln Generic drug: carboxymethylcellulose Place 1 drop into both eyes 2 (two) times daily.   Refresh Liquigel 1 % Gel Generic drug: Carboxymethylcellulose Sodium Place 1 Application into both eyes at bedtime.   Selenium 100 MCG Tabs Take 100 mcg by mouth 2 (two) times daily.               Discharge Care Instructions  (From admission, onward)           Start     Ordered   10/10/22 0000  No dressing needed        10/10/22 [id number]            Follow-up Information     Armandina Gemma, MD. Schedule an appointment as soon as possible for a visit in 3 week(s).   Specialty: General Surgery Why: For wound re-check Contact information: [place] [address] [phone]                 Modell Fendrick, [facility] Office: [phone]   Signed: Armandina Gemma 10/10/2022, 9:03 AM

## 2022-10-10 NOTE — Plan of Care (Signed)

## 2022-10-13 LAB — SURGICAL PATHOLOGY

## 2022-10-13 NOTE — Progress Notes (Signed)
Pathology benign as expected.  Armandina Gemma, MD [facility] A [place] Office: [phone]

## 2022-10-23 DIAGNOSIS — E890 Postprocedural hypothyroidism: Secondary | ICD-10-CM | POA: Diagnosis not present

## 2022-10-23 DIAGNOSIS — E0520 Thyrotoxicosis with toxic multinodular goiter without thyrotoxic crisis or storm: Secondary | ICD-10-CM | POA: Diagnosis not present

## 2022-11-05 DIAGNOSIS — E890 Postprocedural hypothyroidism: Secondary | ICD-10-CM | POA: Diagnosis not present

## 2022-11-05 DIAGNOSIS — Z8639 Personal history of other endocrine, nutritional and metabolic disease: Secondary | ICD-10-CM | POA: Diagnosis not present

## 2022-11-13 ENCOUNTER — Ambulatory Visit
Admission: RE | Admit: 2022-11-13 | Discharge: 2022-11-13 | Disposition: A | Discharge status: Home / Self Care | Payer: Medicare HMO | Source: Ambulatory Visit | Attending: Family Medicine | Admitting: Family Medicine

## 2022-11-13 DIAGNOSIS — E890 Postprocedural hypothyroidism: Secondary | ICD-10-CM | POA: Diagnosis not present

## 2022-11-13 DIAGNOSIS — E790 Hyperuricemia without signs of inflammatory arthritis and tophaceous disease: Secondary | ICD-10-CM | POA: Diagnosis not present

## 2022-11-13 DIAGNOSIS — M5136 Other intervertebral disc degeneration, lumbar region: Secondary | ICD-10-CM | POA: Diagnosis not present

## 2022-11-13 DIAGNOSIS — Z6841 Body Mass Index (BMI) 40.0 and over, adult: Secondary | ICD-10-CM | POA: Diagnosis not present

## 2022-11-13 DIAGNOSIS — R609 Edema, unspecified: Secondary | ICD-10-CM | POA: Diagnosis not present

## 2022-11-13 DIAGNOSIS — E7800 Pure hypercholesterolemia, unspecified: Secondary | ICD-10-CM | POA: Diagnosis not present

## 2022-11-13 DIAGNOSIS — I10 Essential (primary) hypertension: Secondary | ICD-10-CM | POA: Diagnosis not present

## 2022-11-13 DIAGNOSIS — Z1231 Encounter for screening mammogram for malignant neoplasm of breast: Secondary | ICD-10-CM

## 2023-05-06 DIAGNOSIS — E890 Postprocedural hypothyroidism: Secondary | ICD-10-CM | POA: Diagnosis not present

## 2023-05-06 DIAGNOSIS — Z8639 Personal history of other endocrine, nutritional and metabolic disease: Secondary | ICD-10-CM | POA: Diagnosis not present

## 2023-05-20 DIAGNOSIS — Z961 Presence of intraocular lens: Secondary | ICD-10-CM | POA: Diagnosis not present

## 2023-05-20 DIAGNOSIS — E0500 Thyrotoxicosis with diffuse goiter without thyrotoxic crisis or storm: Secondary | ICD-10-CM | POA: Diagnosis not present

## 2023-06-02 DIAGNOSIS — M51369 Other intervertebral disc degeneration, lumbar region without mention of lumbar back pain or lower extremity pain: Secondary | ICD-10-CM | POA: Diagnosis not present

## 2023-06-02 DIAGNOSIS — Z0000 Encounter for general adult medical examination without abnormal findings: Secondary | ICD-10-CM | POA: Diagnosis not present

## 2023-06-02 DIAGNOSIS — Z23 Encounter for immunization: Secondary | ICD-10-CM | POA: Diagnosis not present

## 2023-06-02 DIAGNOSIS — Z1331 Encounter for screening for depression: Secondary | ICD-10-CM | POA: Diagnosis not present

## 2023-06-02 DIAGNOSIS — I10 Essential (primary) hypertension: Secondary | ICD-10-CM | POA: Diagnosis not present

## 2023-06-02 DIAGNOSIS — Z8639 Personal history of other endocrine, nutritional and metabolic disease: Secondary | ICD-10-CM | POA: Diagnosis not present

## 2023-06-02 DIAGNOSIS — E7800 Pure hypercholesterolemia, unspecified: Secondary | ICD-10-CM | POA: Diagnosis not present

## 2023-06-02 DIAGNOSIS — Z6841 Body Mass Index (BMI) 40.0 and over, adult: Secondary | ICD-10-CM | POA: Diagnosis not present

## 2023-06-02 DIAGNOSIS — E790 Hyperuricemia without signs of inflammatory arthritis and tophaceous disease: Secondary | ICD-10-CM | POA: Diagnosis not present

## 2023-06-09 DIAGNOSIS — Z1211 Encounter for screening for malignant neoplasm of colon: Secondary | ICD-10-CM | POA: Diagnosis not present

## 2023-07-17 DIAGNOSIS — E890 Postprocedural hypothyroidism: Secondary | ICD-10-CM | POA: Diagnosis not present

## 2023-08-06 DIAGNOSIS — Z83719 Family history of colon polyps, unspecified: Secondary | ICD-10-CM | POA: Diagnosis not present

## 2023-08-06 DIAGNOSIS — R195 Other fecal abnormalities: Secondary | ICD-10-CM | POA: Diagnosis not present

## 2023-09-03 DIAGNOSIS — R195 Other fecal abnormalities: Secondary | ICD-10-CM | POA: Diagnosis not present

## 2023-09-03 DIAGNOSIS — Z83719 Family history of colon polyps, unspecified: Secondary | ICD-10-CM | POA: Diagnosis not present

## 2023-09-03 DIAGNOSIS — Z1211 Encounter for screening for malignant neoplasm of colon: Secondary | ICD-10-CM | POA: Diagnosis not present

## 2023-09-03 DIAGNOSIS — K5730 Diverticulosis of large intestine without perforation or abscess without bleeding: Secondary | ICD-10-CM | POA: Diagnosis not present

## 2023-09-03 DIAGNOSIS — D125 Benign neoplasm of sigmoid colon: Secondary | ICD-10-CM | POA: Diagnosis not present

## 2023-09-03 DIAGNOSIS — K648 Other hemorrhoids: Secondary | ICD-10-CM | POA: Diagnosis not present

## 2023-09-30 ENCOUNTER — Other Ambulatory Visit: Payer: Self-pay | Admitting: Family Medicine

## 2023-09-30 DIAGNOSIS — Z1231 Encounter for screening mammogram for malignant neoplasm of breast: Secondary | ICD-10-CM

## 2023-11-16 ENCOUNTER — Ambulatory Visit

## 2023-11-17 ENCOUNTER — Ambulatory Visit
Admission: RE | Admit: 2023-11-17 | Discharge: 2023-11-17 | Disposition: A | Discharge status: Home / Self Care | Source: Ambulatory Visit | Attending: Family Medicine | Admitting: Family Medicine

## 2023-11-17 DIAGNOSIS — Z1231 Encounter for screening mammogram for malignant neoplasm of breast: Secondary | ICD-10-CM

## 2023-12-07 DIAGNOSIS — M2550 Pain in unspecified joint: Secondary | ICD-10-CM | POA: Diagnosis not present

## 2023-12-07 DIAGNOSIS — R609 Edema, unspecified: Secondary | ICD-10-CM | POA: Diagnosis not present

## 2023-12-07 DIAGNOSIS — I10 Essential (primary) hypertension: Secondary | ICD-10-CM | POA: Diagnosis not present

## 2023-12-07 DIAGNOSIS — M51369 Other intervertebral disc degeneration, lumbar region without mention of lumbar back pain or lower extremity pain: Secondary | ICD-10-CM | POA: Diagnosis not present

## 2023-12-07 DIAGNOSIS — E890 Postprocedural hypothyroidism: Secondary | ICD-10-CM | POA: Diagnosis not present

## 2023-12-07 DIAGNOSIS — E7800 Pure hypercholesterolemia, unspecified: Secondary | ICD-10-CM | POA: Diagnosis not present

## 2023-12-07 DIAGNOSIS — E790 Hyperuricemia without signs of inflammatory arthritis and tophaceous disease: Secondary | ICD-10-CM | POA: Diagnosis not present

## 2024-01-21 DIAGNOSIS — E890 Postprocedural hypothyroidism: Secondary | ICD-10-CM | POA: Diagnosis not present

## 2024-05-25 DIAGNOSIS — Z961 Presence of intraocular lens: Secondary | ICD-10-CM | POA: Diagnosis not present

## 2024-05-25 DIAGNOSIS — E0590 Thyrotoxicosis, unspecified without thyrotoxic crisis or storm: Secondary | ICD-10-CM | POA: Diagnosis not present
